# Patient Record
Sex: Female | Born: 1950 | Race: White | Hispanic: No | Marital: Single | Smoking: Current every day smoker
Health system: Southern US, Community
[De-identification: ages and names within clinical notes are randomized; demographics above are authoritative.]

## PROBLEM LIST (undated history)

## (undated) DIAGNOSIS — R531 Weakness: Secondary | ICD-10-CM

## (undated) DIAGNOSIS — J449 Chronic obstructive pulmonary disease, unspecified: Secondary | ICD-10-CM

## (undated) DIAGNOSIS — F609 Personality disorder, unspecified: Secondary | ICD-10-CM

## (undated) DIAGNOSIS — F319 Bipolar disorder, unspecified: Secondary | ICD-10-CM

## (undated) DIAGNOSIS — M8580 Other specified disorders of bone density and structure, unspecified site: Secondary | ICD-10-CM

## (undated) HISTORY — PX: OTHER SURGICAL HISTORY: SHX169

---

## 1998-03-26 ENCOUNTER — Encounter: Admission: RE | Admit: 1998-03-26 | Discharge: 1998-03-26 | Payer: Self-pay | Admitting: Family Medicine

## 1999-03-22 ENCOUNTER — Inpatient Hospital Stay (HOSPITAL_COMMUNITY): Admission: AD | Admit: 1999-03-22 | Discharge: 1999-04-01 | Payer: Self-pay | Admitting: Specialist

## 1999-08-07 ENCOUNTER — Encounter: Admission: RE | Admit: 1999-08-07 | Discharge: 1999-08-07 | Payer: Self-pay | Admitting: Family Medicine

## 1999-11-18 ENCOUNTER — Encounter: Admission: RE | Admit: 1999-11-18 | Discharge: 1999-11-18 | Payer: Self-pay | Admitting: Sports Medicine

## 1999-12-23 ENCOUNTER — Encounter: Payer: Self-pay | Admitting: Sports Medicine

## 1999-12-23 ENCOUNTER — Encounter: Admission: RE | Admit: 1999-12-23 | Discharge: 1999-12-23 | Payer: Self-pay | Admitting: *Deleted

## 2001-05-04 ENCOUNTER — Ambulatory Visit (HOSPITAL_COMMUNITY): Admission: RE | Admit: 2001-05-04 | Discharge: 2001-05-04 | Payer: Self-pay | Admitting: Family Medicine

## 2002-07-20 ENCOUNTER — Emergency Department (HOSPITAL_COMMUNITY): Admission: EM | Admit: 2002-07-20 | Discharge: 2002-07-20 | Payer: Self-pay | Admitting: Emergency Medicine

## 2003-06-18 ENCOUNTER — Encounter: Payer: Self-pay | Admitting: Internal Medicine

## 2003-06-18 ENCOUNTER — Encounter: Admission: RE | Admit: 2003-06-18 | Discharge: 2003-06-18 | Payer: Self-pay | Admitting: Internal Medicine

## 2004-07-02 ENCOUNTER — Encounter: Admission: RE | Admit: 2004-07-02 | Discharge: 2004-07-02 | Payer: Self-pay | Admitting: Internal Medicine

## 2006-01-21 ENCOUNTER — Encounter: Admission: RE | Admit: 2006-01-21 | Discharge: 2006-01-21 | Payer: Self-pay | Admitting: Internal Medicine

## 2007-01-22 ENCOUNTER — Emergency Department (HOSPITAL_COMMUNITY): Admission: EM | Admit: 2007-01-22 | Discharge: 2007-01-22 | Payer: Self-pay | Admitting: Emergency Medicine

## 2010-08-06 ENCOUNTER — Encounter: Admission: RE | Admit: 2010-08-06 | Discharge: 2010-08-06 | Payer: Self-pay | Admitting: Geriatric Medicine

## 2010-12-29 ENCOUNTER — Encounter: Payer: Self-pay | Admitting: Internal Medicine

## 2011-10-08 ENCOUNTER — Other Ambulatory Visit: Payer: Self-pay | Admitting: Geriatric Medicine

## 2011-10-08 DIAGNOSIS — Z1231 Encounter for screening mammogram for malignant neoplasm of breast: Secondary | ICD-10-CM

## 2011-11-17 ENCOUNTER — Ambulatory Visit: Payer: Self-pay

## 2012-01-11 ENCOUNTER — Emergency Department (HOSPITAL_COMMUNITY)
Admission: EM | Admit: 2012-01-11 | Discharge: 2012-01-12 | Disposition: A | Discharge status: Home / Self Care | Payer: Medicaid Other | Attending: Emergency Medicine | Admitting: Emergency Medicine

## 2012-01-11 ENCOUNTER — Encounter (HOSPITAL_COMMUNITY): Payer: Self-pay | Admitting: *Deleted

## 2012-01-11 DIAGNOSIS — M25539 Pain in unspecified wrist: Secondary | ICD-10-CM | POA: Insufficient documentation

## 2012-01-11 DIAGNOSIS — W1809XA Striking against other object with subsequent fall, initial encounter: Secondary | ICD-10-CM | POA: Insufficient documentation

## 2012-01-11 DIAGNOSIS — S52509A Unspecified fracture of the lower end of unspecified radius, initial encounter for closed fracture: Secondary | ICD-10-CM | POA: Insufficient documentation

## 2012-01-11 DIAGNOSIS — M533 Sacrococcygeal disorders, not elsewhere classified: Secondary | ICD-10-CM | POA: Insufficient documentation

## 2012-01-11 DIAGNOSIS — F17200 Nicotine dependence, unspecified, uncomplicated: Secondary | ICD-10-CM | POA: Insufficient documentation

## 2012-01-11 DIAGNOSIS — Z8659 Personal history of other mental and behavioral disorders: Secondary | ICD-10-CM | POA: Insufficient documentation

## 2012-01-11 DIAGNOSIS — S52209A Unspecified fracture of shaft of unspecified ulna, initial encounter for closed fracture: Secondary | ICD-10-CM

## 2012-01-11 NOTE — ED Notes (Signed)
Bed:WA05<BR> Expected date:<BR> Expected time:<BR> Means of arrival:<BR> Comments:<BR> EMS

## 2012-01-11 NOTE — ED Notes (Signed)
Pt. Brought in by EMS from [facility], presents with right hand pain after patient tripped and fell approx. 3 hours ago. Patient reports hitting right hand, coccyx, and back of head. Small hematoma noted to right posterior hand/wrist. Patient also reports "hearing voices" lately and would like to be seen for that as well.

## 2012-01-12 ENCOUNTER — Emergency Department (HOSPITAL_COMMUNITY): Payer: Medicaid Other

## 2012-01-12 ENCOUNTER — Encounter (HOSPITAL_COMMUNITY): Payer: Self-pay | Admitting: Emergency Medicine

## 2012-01-12 MED ORDER — OXYCODONE-ACETAMINOPHEN 5-325 MG PO TABS
1.0000 | ORAL_TABLET | Freq: Once | ORAL | Status: AC
  Administered 2012-01-12: 1 via ORAL
  Filled 2012-01-12: qty 1

## 2012-01-12 MED ORDER — IBUPROFEN 800 MG PO TABS: 800.0000 mg | ORAL_TABLET | Freq: Three times a day (TID) | ORAL | Status: AC

## 2012-01-12 MED ORDER — IBUPROFEN 800 MG PO TABS
800.0000 mg | ORAL_TABLET | Freq: Once | ORAL | Status: AC
  Administered 2012-01-12: 800 mg via ORAL
  Filled 2012-01-12: qty 1

## 2012-01-12 MED ORDER — OXYCODONE-ACETAMINOPHEN 5-325 MG PO TABS: 1.0000 | ORAL_TABLET | Freq: Four times a day (QID) | ORAL | Status: AC | PRN

## 2012-01-12 NOTE — ED Provider Notes (Signed)
History     CSN: [id number]  Arrival date & time 01/11/12  2205   First MD Initiated Contact with Patient 01/12/12 0010      Chief Complaint  Patient presents with  . Hand Injury    RIGHT  . Fall    (Consider location/radiation/quality/duration/timing/severity/associated sxs/prior treatment) HPI Patient is a left-hand dominant 61 yo F who presents after mechanical fall with right wrist pain that is a 10/10.  Patient is neurovascularly intact with pain over the dorsum of the right wrist with slight deformity and hematoma.  She also endorses striking her head and landing on her tailbone tough she denies loss of consciousness or neck or back pain.  Patient has history of mental health issues but is organized and appropriate here with no suicidal or homicidal ideation.  She does not feel she needs mental health evaluation this evening.  Patient has no other injuries and her injuries are worse with movement and palpation though she can ambulate easily.  There are no other associated or modifying factors.  Past Medical History  Diagnosis Date  . Schizoaffective disorder     History reviewed. No pertinent past surgical history.  History reviewed. No pertinent family history.  History  Substance Use Topics  . Smoking status: Current Everyday Smoker -- 2.5 packs/day    Types: Cigarettes  . Smokeless tobacco: Never Used  . Alcohol Use: Yes     once a month    OB History    Grav Para Term Preterm Abortions TAB SAB Ect Mult Living                  Review of Systems  Constitutional: Negative.   HENT: Negative for neck pain.        Pain on the back of her head where she fell  Eyes: Negative.   Respiratory: Negative.   Cardiovascular: Negative.   Gastrointestinal: Negative.   Genitourinary: Negative.   Musculoskeletal: Negative for back pain.       Right wrist and sacral pain  Skin: Negative.   Neurological: Negative.   Hematological: Negative.   Psychiatric/Behavioral:  Negative.   All other systems reviewed and are negative.    Allergies  Review of patient's allergies indicates no known allergies.  Home Medications  No current outpatient prescriptions on file.  BP 132/73  Pulse 78  Temp(Src) 97.9 F (36.6 C) (Oral)  Resp 17  Ht 5\' 2"  (1.575 m)  Wt 111 lb (50.349 kg)  BMI 20.30 kg/m2  SpO2 100%  Physical Exam  Nursing note and vitals reviewed. GEN: Well-developed, female in no distress who is thin and appears older than stated age HEENT: Normocephalic. Oropharynx clear without erythema, small hematoma over the occiput EYES: PERRLA BL, no scleral icterus. NECK: Trachea midline, no meningismus CV: regular rate and rhythm. No murmurs, rubs, or gallops PULM: No respiratory distress.  No crackles, wheezes, or rales. GI: soft, non-tender. No guarding, rebound, or tenderness. + bowel sounds  Neuro: cranial nerves 2-12 intact, no abnormalities of strength or sensation, A and O x 3 MSK:c/t/l spine with no TTP or step-off, slight sacral TTP but patient ambulates easily, right wrist with deformity and slight bruising noted dorsally, NVI Psych: abnormal affect but organized and oriented with no active psychosis, delusions, SI, or HI  ED Course  Procedures (including critical care time)  Labs Reviewed - No data to display Dg Pelvis 1-2 Views  01/12/2012  *RADIOLOGY REPORT*  Clinical Data: Pain post fall  PELVIS -  1-2 VIEW  Comparison: None.  Findings: Negative for fracture, dislocation, or other acute abnormality.  Normal alignment and mineralization. No significant degenerative change.  Regional soft tissues unremarkable.  IMPRESSION:  Negative  Original Report Authenticated By: Osa Craver, M.D.   Dg Wrist Complete Right  01/12/2012  *RADIOLOGY REPORT*  Clinical Data: Pain post fall.  RIGHT WRIST - COMPLETE 3+ VIEW  Comparison: None.  Findings: There is a minimally distracted fracture of the ulnar styloid.  There is a transverse fracture of  the distal radial metaphysis, dorsally impacted .  There is neutral angulation of the distal radial articular surface.  No  definite intra-articular extension of the fracture is evident.  Carpal rows intact.  Normal mineralization and alignment.  IMPRESSION:  1.  Transverse distal radial fracture with neutral angulation. 2.  Minimally distracted ulnar styloid fracture.  Original Report Authenticated By: Osa Craver, M.D.   Ct Head Wo Contrast  01/12/2012  *RADIOLOGY REPORT*  Clinical Data: Schizoaffective disorder  CT HEAD WITHOUT CONTRAST  Technique:  Contiguous axial images were obtained from the base of the skull through the vertex without contrast.  Comparison: None.  Findings: Periventricular and subcortical white matter hypodensities are nonspecific however most often seen with chronic microangiopathic change. There is no evidence for acute hemorrhage, hydrocephalus, mass lesion, or abnormal extra-axial fluid collection.  No definite CT evidence for acute infarction.  The visualized paranasal sinuses and mastoid air cells are predominately clear.  IMPRESSION: Mild white matter hypodensities.  No definite acute intracranial abnormality.  Original Report Authenticated By: Waneta Martins, M.D.     1. Fracture of radius and ulna, closed       MDM  Patient with right radius and ulna fracture without tenting or open defect.  Given other symptoms CT head and plain film of sacrum were performed and unremarkable.  She had percocet and ibuprofen for pain.  Ice and sugartong splint were applied.  Patient was reassesed following splinting and was NVI.  Patient was referred to Dr. Mina Marble who was on for hand.  She was discharged with prescriptions for percocet and ibuprofen in good condition and stated understanding of discharge instructions.        Cyndra Numbers, MD 01/12/12 [phone]

## 2012-01-12 NOTE — ED Notes (Signed)
Patient picked up by Zenaida Niece and transported to [facility] with staff from same. No complaints of pain or discomfort. No questions about discharge. VSS.

## 2012-01-12 NOTE — ED Notes (Signed)
Patient transported to CT 

## 2012-01-27 ENCOUNTER — Ambulatory Visit: Payer: Self-pay

## 2012-02-04 ENCOUNTER — Ambulatory Visit: Payer: Self-pay

## 2012-02-05 DEATH — deceased

## 2012-02-11 ENCOUNTER — Ambulatory Visit: Payer: Medicaid Other | Admitting: Occupational Therapy

## 2012-02-16 ENCOUNTER — Ambulatory Visit: Payer: Medicaid Other | Attending: Orthopedic Surgery | Admitting: Occupational Therapy

## 2012-02-16 DIAGNOSIS — R609 Edema, unspecified: Secondary | ICD-10-CM | POA: Insufficient documentation

## 2012-02-16 DIAGNOSIS — M25539 Pain in unspecified wrist: Secondary | ICD-10-CM | POA: Insufficient documentation

## 2012-02-16 DIAGNOSIS — IMO0001 Reserved for inherently not codable concepts without codable children: Secondary | ICD-10-CM | POA: Insufficient documentation

## 2012-02-24 ENCOUNTER — Encounter: Payer: Medicaid Other | Admitting: Occupational Therapy

## 2012-07-01 ENCOUNTER — Encounter (HOSPITAL_COMMUNITY): Payer: Self-pay | Admitting: *Deleted

## 2012-07-01 DIAGNOSIS — G510 Bell's palsy: Secondary | ICD-10-CM | POA: Insufficient documentation

## 2012-07-01 DIAGNOSIS — F259 Schizoaffective disorder, unspecified: Secondary | ICD-10-CM | POA: Insufficient documentation

## 2012-07-01 DIAGNOSIS — M949 Disorder of cartilage, unspecified: Secondary | ICD-10-CM | POA: Insufficient documentation

## 2012-07-01 DIAGNOSIS — F17200 Nicotine dependence, unspecified, uncomplicated: Secondary | ICD-10-CM | POA: Insufficient documentation

## 2012-07-01 DIAGNOSIS — F319 Bipolar disorder, unspecified: Secondary | ICD-10-CM | POA: Insufficient documentation

## 2012-07-01 DIAGNOSIS — M899 Disorder of bone, unspecified: Secondary | ICD-10-CM | POA: Insufficient documentation

## 2012-07-01 NOTE — ED Notes (Addendum)
Per EMS: pt started having right facial "droop" and and her right eye would not close. Pt states that her right side of face is more sensitive than left. Pt also worried about sores on chin. Pt from [facility]. Pt ambulatory and no neuro deficits.

## 2012-07-02 ENCOUNTER — Emergency Department (HOSPITAL_COMMUNITY): Payer: Medicaid Other

## 2012-07-02 ENCOUNTER — Emergency Department (HOSPITAL_COMMUNITY)
Admission: EM | Admit: 2012-07-02 | Discharge: 2012-07-02 | Disposition: A | Discharge status: Home / Self Care | Payer: Medicaid Other | Attending: Emergency Medicine | Admitting: Emergency Medicine

## 2012-07-02 DIAGNOSIS — G510 Bell's palsy: Secondary | ICD-10-CM

## 2012-07-02 HISTORY — DX: Weakness: R53.1

## 2012-07-02 HISTORY — DX: Personality disorder, unspecified: F60.9

## 2012-07-02 HISTORY — DX: Bipolar disorder, unspecified: F31.9

## 2012-07-02 HISTORY — DX: Other specified disorders of bone density and structure, unspecified site: M85.80

## 2012-07-02 LAB — URINALYSIS, ROUTINE W REFLEX MICROSCOPIC
Nitrite: NEGATIVE
Specific Gravity, Urine: 1.005 (ref 1.005–1.030)
Urobilinogen, UA: 0.2 mg/dL (ref 0.0–1.0)

## 2012-07-02 LAB — URINE MICROSCOPIC-ADD ON

## 2012-07-02 MED ORDER — POLYVINYL ALCOHOL 1.4 % OP SOLN
2.0000 [drp] | OPHTHALMIC | Status: DC | PRN
  Administered 2012-07-02: 2 [drp] via OPHTHALMIC
  Filled 2012-07-02: qty 15

## 2012-07-02 MED ORDER — PREDNISONE 20 MG PO TABS: 60.0000 mg | ORAL_TABLET | Freq: Every day | ORAL | Status: AC

## 2012-07-02 MED ORDER — VALACYCLOVIR HCL 1 G PO TABS: 1000.0000 mg | ORAL_TABLET | Freq: Three times a day (TID) | ORAL | Status: AC

## 2012-07-02 NOTE — ED Provider Notes (Signed)
History     CSN: [id number]  Arrival date & time 07/01/12  2320   First MD Initiated Contact with Patient 07/02/12 0242     2:23 AM HPI This reports the last 3 days has had increasing left-sided facial droop and difficulty closing her eye. Reports difficulty speaking because of mouth drooping. Denies difficulty ambulating. Denies numbness tingling of extremities. Patient reports initially small lesions on her chin prior to symptoms. No improvement with rest.Denies headache The history is provided by the patient.    Past Medical History  Diagnosis Date  . Schizoaffective disorder   . Personality disorder   . Osteopenia   . Bipolar disorder   . Weakness     History reviewed. No pertinent past surgical history.  History reviewed. No pertinent family history.  History  Substance Use Topics  . Smoking status: Current Everyday Smoker -- 2.5 packs/day    Types: Cigarettes  . Smokeless tobacco: Never Used  . Alcohol Use: Yes     once a month    OB History    Grav Para Term Preterm Abortions TAB SAB Ect Mult Living                  Review of Systems  Eyes: Positive for redness.  Neurological: Positive for facial asymmetry and speech difficulty. Negative for dizziness, syncope, weakness, light-headedness, numbness and headaches.    Allergies  Review of patient's allergies indicates no known allergies.  Home Medications   Current Outpatient Rx  Name Route Sig Dispense Refill  . LITHIUM CARBONATE ER 300 MG PO TBCR Oral Take 300 mg by mouth 3 (three) times daily.     Marland Kitchen OLANZAPINE 10 MG PO TABS Oral Take 10 mg by mouth at bedtime.    . TOPIRAMATE 25 MG PO TABS Oral Take 25 mg by mouth 2 (two) times daily.      BP 131/88  Pulse 67  Temp 97.7 F (36.5 C) (Oral)  Resp 18  SpO2 100%  Physical Exam  Vitals reviewed. Constitutional: She is oriented to person, place, and time. Vital signs are normal. She appears well-developed and well-nourished.  HENT:  Head:  Normocephalic and atraumatic.  Right Ear: External ear normal.  Left Ear: External ear normal.  Nose: Nose normal.  Mouth/Throat: Oropharynx is clear and moist. No oropharyngeal exudate.  Eyes: EOM are normal. Pupils are equal, round, and reactive to light.       Left ptosis and conjunctival erythema and injection .   Neck: Normal range of motion. Neck supple.  Cardiovascular: Normal rate, regular rhythm and normal heart sounds.  Exam reveals no friction rub.   No murmur heard. Pulmonary/Chest: Effort normal and breath sounds normal. She has no wheezes. She has no rhonchi. She has no rales. She exhibits no tenderness.  Musculoskeletal: Normal range of motion.  Neurological: She is alert and oriented to person, place, and time. No cranial nerve deficit (tested CN III-XII). Coordination (Negative past-pointing, pronator drift) normal.       Left-sided facial droop and inability to wrinkle left for head.  Skin: Skin is warm and dry. No rash noted. No erythema. No pallor.    ED Course  Procedures   Results for orders placed during the hospital encounter of 07/02/12  URINALYSIS, ROUTINE W REFLEX MICROSCOPIC      Component Value Range   Color, Urine YELLOW  YELLOW   APPearance CLEAR  CLEAR   Specific Gravity, Urine 1.005  1.005 - 1.030   pH  7.0  5.0 - 8.0   Glucose, UA NEGATIVE  NEGATIVE mg/dL   Hgb urine dipstick NEGATIVE  NEGATIVE   Bilirubin Urine NEGATIVE  NEGATIVE   Ketones, ur NEGATIVE  NEGATIVE mg/dL   Protein, ur NEGATIVE  NEGATIVE mg/dL   Urobilinogen, UA 0.2  0.0 - 1.0 mg/dL   Nitrite NEGATIVE  NEGATIVE   Leukocytes, UA SMALL (*) NEGATIVE  URINE MICROSCOPIC-ADD ON      Component Value Range   Squamous Epithelial / LPF RARE  RARE   WBC, UA 3-6  <3 WBC/hpf   RBC / HPF 0-2  <3 RBC/hpf   Bacteria, UA RARE  RARE   Ct Head Wo Contrast  07/02/2012  *RADIOLOGY REPORT*  Clinical Data: Left facial droop.  CT HEAD WITHOUT CONTRAST  Technique:  Contiguous axial images were  obtained from the base of the skull through the vertex without contrast.  Comparison: 01/12/2012  Findings: Periventricular and subcortical white matter hypodensities are most in keeping with chronic microangiopathic change. There is no evidence for acute hemorrhage, hydrocephalus, mass lesion, or abnormal extra-axial fluid collection.  No definite CT evidence for acute infarction.  The visualized paranasal sinuses and mastoid air cells are predominately clear.  IMPRESSION: Mild white matter changes are similar to prior.  No definite CT evidence of acute intracranial abnormality. MRI recommended if clinical concern for acute ischemia persists.  Original Report Authenticated By: Waneta Martins, M.D.       MDM  Exam was negative, no focal neurological findings. CT scan was negative. Patient has obvious Bell's palsy. Treated with one week of prednisone and valacyclovir. Also given a clear eye patch and advised taking I during the evening. Refer patient to ophthalmology and neurology. Patient voices understanding and is ready for discharge       [name], Cordelia Poche 07/02/12 0502

## 2012-07-02 NOTE — ED Provider Notes (Signed)
Medical screening examination/treatment/procedure(s) were conducted as a shared visit with non-physician practitioner(s) and myself.  I personally evaluated the patient during the encounter  3 days of L facial droop.  Forehead not spared.  Flaccid L face, unable to close eye.  5/5 strength extremities. Exam consistent with Bell's palsy.  Glynn Octave, MD 07/02/12 [phone]

## 2012-12-15 ENCOUNTER — Other Ambulatory Visit: Payer: Self-pay | Admitting: Geriatric Medicine

## 2012-12-15 DIAGNOSIS — Z1231 Encounter for screening mammogram for malignant neoplasm of breast: Secondary | ICD-10-CM

## 2013-01-11 ENCOUNTER — Ambulatory Visit: Payer: Medicaid Other

## 2013-10-07 DEATH — deceased

## 2015-01-15 ENCOUNTER — Other Ambulatory Visit: Payer: Self-pay | Admitting: Orthopaedic Surgery

## 2015-01-15 DIAGNOSIS — M25572 Pain in left ankle and joints of left foot: Secondary | ICD-10-CM

## 2015-01-28 ENCOUNTER — Other Ambulatory Visit: Payer: Medicaid Other

## 2015-01-30 ENCOUNTER — Ambulatory Visit
Admission: RE | Admit: 2015-01-30 | Discharge: 2015-01-30 | Disposition: A | Discharge status: Home / Self Care | Payer: Medicaid Other | Source: Ambulatory Visit | Attending: Orthopaedic Surgery | Admitting: Orthopaedic Surgery

## 2015-01-30 DIAGNOSIS — M25572 Pain in left ankle and joints of left foot: Secondary | ICD-10-CM

## 2015-01-30 MED ORDER — GADOBENATE DIMEGLUMINE 529 MG/ML IV SOLN
9.0000 mL | Freq: Once | INTRAVENOUS | Status: AC | PRN
  Administered 2015-01-30: 9 mL via INTRAVENOUS

## 2015-07-23 ENCOUNTER — Inpatient Hospital Stay (HOSPITAL_COMMUNITY)
Admission: EM | Admit: 2015-07-23 | Discharge: 2015-07-29 | DRG: 194 | Disposition: A | Discharge status: Home / Self Care | Payer: Medicaid Other | Attending: Internal Medicine | Admitting: Internal Medicine

## 2015-07-23 ENCOUNTER — Encounter (HOSPITAL_COMMUNITY): Payer: Self-pay | Admitting: Emergency Medicine

## 2015-07-23 ENCOUNTER — Emergency Department (HOSPITAL_COMMUNITY): Payer: Medicaid Other

## 2015-07-23 DIAGNOSIS — A419 Sepsis, unspecified organism: Secondary | ICD-10-CM

## 2015-07-23 DIAGNOSIS — J440 Chronic obstructive pulmonary disease with acute lower respiratory infection: Secondary | ICD-10-CM | POA: Diagnosis not present

## 2015-07-23 DIAGNOSIS — J449 Chronic obstructive pulmonary disease, unspecified: Secondary | ICD-10-CM | POA: Diagnosis present

## 2015-07-23 DIAGNOSIS — T43595A Adverse effect of other antipsychotics and neuroleptics, initial encounter: Secondary | ICD-10-CM | POA: Diagnosis present

## 2015-07-23 DIAGNOSIS — Y95 Nosocomial condition: Secondary | ICD-10-CM | POA: Diagnosis present

## 2015-07-23 DIAGNOSIS — J189 Pneumonia, unspecified organism: Principal | ICD-10-CM

## 2015-07-23 DIAGNOSIS — N179 Acute kidney failure, unspecified: Secondary | ICD-10-CM | POA: Diagnosis present

## 2015-07-23 DIAGNOSIS — F319 Bipolar disorder, unspecified: Secondary | ICD-10-CM | POA: Diagnosis present

## 2015-07-23 DIAGNOSIS — F17210 Nicotine dependence, cigarettes, uncomplicated: Secondary | ICD-10-CM | POA: Diagnosis present

## 2015-07-23 DIAGNOSIS — Z79899 Other long term (current) drug therapy: Secondary | ICD-10-CM | POA: Diagnosis not present

## 2015-07-23 DIAGNOSIS — N251 Nephrogenic diabetes insipidus: Secondary | ICD-10-CM | POA: Diagnosis present

## 2015-07-23 DIAGNOSIS — Z23 Encounter for immunization: Secondary | ICD-10-CM

## 2015-07-23 DIAGNOSIS — Z825 Family history of asthma and other chronic lower respiratory diseases: Secondary | ICD-10-CM

## 2015-07-23 DIAGNOSIS — F609 Personality disorder, unspecified: Secondary | ICD-10-CM | POA: Diagnosis present

## 2015-07-23 DIAGNOSIS — F259 Schizoaffective disorder, unspecified: Secondary | ICD-10-CM | POA: Diagnosis present

## 2015-07-23 HISTORY — DX: Chronic obstructive pulmonary disease, unspecified: J44.9

## 2015-07-23 LAB — CBC WITH DIFFERENTIAL/PLATELET
BASOS PCT: 0 % (ref 0–1)
Basophils Absolute: 0 10*3/uL (ref 0.0–0.1)
EOS ABS: 0 10*3/uL (ref 0.0–0.7)
EOS PCT: 0 % (ref 0–5)
HCT: 42.3 % (ref 36.0–46.0)
Hemoglobin: 13.7 g/dL (ref 12.0–15.0)
LYMPHS ABS: 1.1 10*3/uL (ref 0.7–4.0)
Lymphocytes Relative: 5 % — ABNORMAL LOW (ref 12–46)
MCH: 33.7 pg (ref 26.0–34.0)
MCHC: 32.4 g/dL (ref 30.0–36.0)
MCV: 104.2 fL — AB (ref 78.0–100.0)
MONO ABS: 2.5 10*3/uL — AB (ref 0.1–1.0)
Monocytes Relative: 11 % (ref 3–12)
Neutro Abs: 18.9 10*3/uL — ABNORMAL HIGH (ref 1.7–7.7)
Neutrophils Relative %: 84 % — ABNORMAL HIGH (ref 43–77)
PLATELETS: 232 10*3/uL (ref 150–400)
RBC: 4.06 MIL/uL (ref 3.87–5.11)
RDW: 13.5 % (ref 11.5–15.5)
WBC: 22.5 10*3/uL — AB (ref 4.0–10.5)

## 2015-07-23 LAB — CREATININE, SERUM
Creatinine, Ser: 1.04 mg/dL — ABNORMAL HIGH (ref 0.44–1.00)
GFR, EST NON AFRICAN AMERICAN: 56 mL/min — AB (ref >60)

## 2015-07-23 LAB — COMPREHENSIVE METABOLIC PANEL
ALK PHOS: 79 U/L (ref 38–126)
ALT: 13 U/L — AB (ref 14–54)
AST: 25 U/L (ref 15–41)
Albumin: 3.6 g/dL (ref 3.5–5.0)
Anion gap: 12 (ref 5–15)
BUN: 9 mg/dL (ref 6–20)
CALCIUM: 9.3 mg/dL (ref 8.9–10.3)
CHLORIDE: 106 mmol/L (ref 101–111)
CO2: 17 mmol/L — ABNORMAL LOW (ref 22–32)
CREATININE: 1.11 mg/dL — AB (ref 0.44–1.00)
GFR, EST AFRICAN AMERICAN: 60 mL/min — AB (ref >60)
GFR, EST NON AFRICAN AMERICAN: 52 mL/min — AB (ref >60)
Glucose, Bld: 202 mg/dL — ABNORMAL HIGH (ref 65–99)
Potassium: 4.2 mmol/L (ref 3.5–5.1)
Sodium: 135 mmol/L (ref 135–145)
Total Bilirubin: 0.5 mg/dL (ref 0.3–1.2)
Total Protein: 6.6 g/dL (ref 6.5–8.1)

## 2015-07-23 LAB — URINALYSIS, ROUTINE W REFLEX MICROSCOPIC
BILIRUBIN URINE: NEGATIVE
Glucose, UA: NEGATIVE mg/dL
Hgb urine dipstick: NEGATIVE
KETONES UR: NEGATIVE mg/dL
Leukocytes, UA: NEGATIVE
NITRITE: NEGATIVE
PROTEIN: NEGATIVE mg/dL
Specific Gravity, Urine: 1.006 (ref 1.005–1.030)
UROBILINOGEN UA: 0.2 mg/dL (ref 0.0–1.0)
pH: 6 (ref 5.0–8.0)

## 2015-07-23 LAB — LITHIUM LEVEL: Lithium Lvl: 0.5 mmol/L — ABNORMAL LOW (ref 0.60–1.20)

## 2015-07-23 LAB — PROCALCITONIN: PROCALCITONIN: 0.81 ng/mL

## 2015-07-23 LAB — LACTIC ACID, PLASMA: Lactic Acid, Venous: 1.1 mmol/L (ref 0.5–2.0)

## 2015-07-23 LAB — CBC
HEMATOCRIT: 39.8 % (ref 36.0–46.0)
HEMOGLOBIN: 12.7 g/dL (ref 12.0–15.0)
MCH: 33.1 pg (ref 26.0–34.0)
MCHC: 31.9 g/dL (ref 30.0–36.0)
MCV: 103.6 fL — ABNORMAL HIGH (ref 78.0–100.0)
Platelets: 221 10*3/uL (ref 150–400)
RBC: 3.84 MIL/uL — ABNORMAL LOW (ref 3.87–5.11)
RDW: 13.6 % (ref 11.5–15.5)
WBC: 14.3 10*3/uL — AB (ref 4.0–10.5)

## 2015-07-23 LAB — I-STAT CG4 LACTIC ACID, ED
LACTIC ACID, VENOUS: 0.89 mmol/L (ref 0.5–2.0)
Lactic Acid, Venous: 3.69 mmol/L (ref 0.5–2.0)

## 2015-07-23 LAB — TROPONIN I

## 2015-07-23 MED ORDER — PIPERACILLIN-TAZOBACTAM 3.375 G IVPB 30 MIN: 3.3750 g | Freq: Three times a day (TID) | INTRAVENOUS | Status: DC

## 2015-07-23 MED ORDER — SODIUM CHLORIDE 0.9 % IV BOLUS (SEPSIS)
500.0000 mL | INTRAVENOUS | Status: AC
  Administered 2015-07-23: 500 mL via INTRAVENOUS

## 2015-07-23 MED ORDER — ONDANSETRON HCL 4 MG PO TABS: 4.0000 mg | ORAL_TABLET | Freq: Four times a day (QID) | ORAL | Status: DC | PRN

## 2015-07-23 MED ORDER — VANCOMYCIN HCL IN DEXTROSE 1-5 GM/200ML-% IV SOLN
1000.0000 mg | Freq: Once | INTRAVENOUS | Status: AC
  Administered 2015-07-23: 1000 mg via INTRAVENOUS
  Filled 2015-07-23: qty 200

## 2015-07-23 MED ORDER — OLANZAPINE 7.5 MG PO TABS
15.0000 mg | ORAL_TABLET | Freq: Every day | ORAL | Status: DC
  Administered 2015-07-23 – 2015-07-28 (×6): 15 mg via ORAL
  Filled 2015-07-23 (×7): qty 2

## 2015-07-23 MED ORDER — OLANZAPINE 5 MG PO TABS
5.0000 mg | ORAL_TABLET | Freq: Every day | ORAL | Status: DC
  Administered 2015-07-24 – 2015-07-29 (×6): 5 mg via ORAL
  Filled 2015-07-23 (×7): qty 1

## 2015-07-23 MED ORDER — PIPERACILLIN-TAZOBACTAM 3.375 G IVPB 30 MIN
3.3750 g | Freq: Once | INTRAVENOUS | Status: AC
  Administered 2015-07-23: 3.375 g via INTRAVENOUS
  Filled 2015-07-23: qty 50

## 2015-07-23 MED ORDER — TOPIRAMATE 25 MG PO TABS
25.0000 mg | ORAL_TABLET | Freq: Every day | ORAL | Status: DC
  Administered 2015-07-24 – 2015-07-29 (×6): 25 mg via ORAL
  Filled 2015-07-23 (×7): qty 1

## 2015-07-23 MED ORDER — SODIUM CHLORIDE 0.9 % IV BOLUS (SEPSIS)
1000.0000 mL | Freq: Once | INTRAVENOUS | Status: AC
  Administered 2015-07-23: 1000 mL via INTRAVENOUS

## 2015-07-23 MED ORDER — LITHIUM CARBONATE ER 300 MG PO TBCR
300.0000 mg | EXTENDED_RELEASE_TABLET | Freq: Two times a day (BID) | ORAL | Status: DC
  Filled 2015-07-23: qty 2

## 2015-07-23 MED ORDER — VANCOMYCIN HCL IN DEXTROSE 1-5 GM/200ML-% IV SOLN: 1000.0000 mg | Freq: Once | INTRAVENOUS | Status: DC

## 2015-07-23 MED ORDER — LITHIUM CARBONATE ER 300 MG PO TBCR
600.0000 mg | EXTENDED_RELEASE_TABLET | Freq: Every day | ORAL | Status: DC
  Administered 2015-07-24 – 2015-07-25 (×3): 600 mg via ORAL
  Filled 2015-07-23 (×5): qty 2

## 2015-07-23 MED ORDER — ACETAMINOPHEN 650 MG RE SUPP: 650.0000 mg | Freq: Four times a day (QID) | RECTAL | Status: DC | PRN

## 2015-07-23 MED ORDER — ENOXAPARIN SODIUM 40 MG/0.4ML ~~LOC~~ SOLN
40.0000 mg | SUBCUTANEOUS | Status: DC
  Administered 2015-07-23 – 2015-07-28 (×6): 40 mg via SUBCUTANEOUS
  Filled 2015-07-23 (×6): qty 0.4

## 2015-07-23 MED ORDER — SODIUM CHLORIDE 0.9 % IV SOLN
INTRAVENOUS | Status: AC
  Administered 2015-07-23 – 2015-07-24 (×2): via INTRAVENOUS

## 2015-07-23 MED ORDER — ACETAMINOPHEN 325 MG PO TABS
650.0000 mg | ORAL_TABLET | Freq: Four times a day (QID) | ORAL | Status: DC | PRN
  Administered 2015-07-24 – 2015-07-28 (×5): 650 mg via ORAL
  Filled 2015-07-23 (×7): qty 2

## 2015-07-23 MED ORDER — IPRATROPIUM BROMIDE 0.02 % IN SOLN
0.5000 mg | RESPIRATORY_TRACT | Status: DC
  Administered 2015-07-24 – 2015-07-25 (×11): 0.5 mg via RESPIRATORY_TRACT
  Filled 2015-07-23 (×11): qty 2.5

## 2015-07-23 MED ORDER — MOMETASONE FURO-FORMOTEROL FUM 100-5 MCG/ACT IN AERO
2.0000 | INHALATION_SPRAY | Freq: Two times a day (BID) | RESPIRATORY_TRACT | Status: DC
  Administered 2015-07-24 – 2015-07-29 (×9): 2 via RESPIRATORY_TRACT
  Filled 2015-07-23 (×3): qty 8.8

## 2015-07-23 MED ORDER — ALBUTEROL SULFATE (2.5 MG/3ML) 0.083% IN NEBU: 2.5000 mg | INHALATION_SOLUTION | RESPIRATORY_TRACT | Status: DC | PRN

## 2015-07-23 MED ORDER — PIPERACILLIN-TAZOBACTAM 3.375 G IVPB
3.3750 g | Freq: Three times a day (TID) | INTRAVENOUS | Status: DC
  Administered 2015-07-24 – 2015-07-28 (×14): 3.375 g via INTRAVENOUS
  Filled 2015-07-23 (×16): qty 50

## 2015-07-23 MED ORDER — LITHIUM CARBONATE ER 300 MG PO TBCR
300.0000 mg | EXTENDED_RELEASE_TABLET | Freq: Every day | ORAL | Status: DC
  Administered 2015-07-24 – 2015-07-26 (×3): 300 mg via ORAL
  Filled 2015-07-23 (×3): qty 1

## 2015-07-23 MED ORDER — GUAIFENESIN ER 600 MG PO TB12
600.0000 mg | ORAL_TABLET | Freq: Two times a day (BID) | ORAL | Status: DC
  Administered 2015-07-23 – 2015-07-29 (×12): 600 mg via ORAL
  Filled 2015-07-23 (×12): qty 1

## 2015-07-23 MED ORDER — ALBUTEROL SULFATE (2.5 MG/3ML) 0.083% IN NEBU
2.5000 mg | INHALATION_SOLUTION | RESPIRATORY_TRACT | Status: DC
  Administered 2015-07-24 – 2015-07-25 (×11): 2.5 mg via RESPIRATORY_TRACT
  Filled 2015-07-23 (×11): qty 3

## 2015-07-23 MED ORDER — TOPIRAMATE 25 MG PO TABS
50.0000 mg | ORAL_TABLET | Freq: Every day | ORAL | Status: DC
  Administered 2015-07-23 – 2015-07-28 (×6): 50 mg via ORAL
  Filled 2015-07-23 (×6): qty 2

## 2015-07-23 MED ORDER — OLANZAPINE 5 MG PO TABS: 5.0000 mg | ORAL_TABLET | Freq: Every day | ORAL | Status: DC

## 2015-07-23 MED ORDER — TOPIRAMATE 25 MG PO TABS: 25.0000 mg | ORAL_TABLET | Freq: Two times a day (BID) | ORAL | Status: DC

## 2015-07-23 MED ORDER — VANCOMYCIN HCL IN DEXTROSE 750-5 MG/150ML-% IV SOLN
750.0000 mg | INTRAVENOUS | Status: DC
  Administered 2015-07-24 – 2015-07-25 (×2): 750 mg via INTRAVENOUS
  Filled 2015-07-23 (×2): qty 150

## 2015-07-23 MED ORDER — ENSURE ENLIVE PO LIQD
237.0000 mL | Freq: Two times a day (BID) | ORAL | Status: DC
  Administered 2015-07-24 – 2015-07-29 (×10): 237 mL via ORAL

## 2015-07-23 MED ORDER — ONDANSETRON HCL 4 MG/2ML IJ SOLN: 4.0000 mg | Freq: Four times a day (QID) | INTRAMUSCULAR | Status: DC | PRN

## 2015-07-23 NOTE — ED Provider Notes (Signed)
CSN: [id number]     Arrival date & time 07/23/15  1700 History   First MD Initiated Contact with Patient 07/23/15 1703     Chief Complaint  Patient presents with  . Fever     (Consider location/radiation/quality/duration/timing/severity/associated sxs/prior Treatment) Patient is a 64 y.o. female presenting with cough. The history is provided by the patient.  Cough Cough characteristics:  Non-productive Severity:  Moderate Onset quality:  Gradual Timing:  Constant Progression:  Worsening Chronicity:  New Smoker: no   Context: not upper respiratory infection and not weather changes   Relieved by:  Nothing Worsened by:  Nothing tried Associated symptoms: no fever and no shortness of breath     Past Medical History  Diagnosis Date  . Schizoaffective disorder   . Personality disorder   . Osteopenia   . Bipolar disorder   . Weakness    History reviewed. No pertinent past surgical history. History reviewed. No pertinent family history. Social History  Substance Use Topics  . Smoking status: Current Every Day Smoker -- 2.50 packs/day    Types: Cigarettes  . Smokeless tobacco: Never Used  . Alcohol Use: Yes     Comment: once a month   OB History    No data available     Review of Systems  Constitutional: Negative for fever.  Respiratory: Negative for cough and shortness of breath.   Gastrointestinal: Negative for vomiting and abdominal pain.  All other systems reviewed and are negative.     Allergies  Review of patient's allergies indicates no known allergies.  Home Medications   Prior to Admission medications   Medication Sig Start Date End Date Taking? Authorizing Provider  lithium carbonate (LITHOBID) 300 MG CR tablet Take 300 mg by mouth 3 (three) times daily.     Historical Provider, MD  OLANZapine (ZYPREXA) 10 MG tablet Take 10 mg by mouth at bedtime.    Historical Provider, MD  topiramate (TOPAMAX) 25 MG tablet Take 25 mg by mouth 2 (two) times daily.     Historical Provider, MD   BP 147/81 mmHg  Pulse 107  Temp(Src) 103.2 F (39.6 C) (Rectal)  Resp 31  Ht 5' 2.75" (1.594 m)  Wt 104 lb (47.174 kg)  BMI 18.57 kg/m2  SpO2 95% Physical Exam  Constitutional: She is oriented to person, place, and time. She appears well-developed and well-nourished. No distress.  HENT:  Head: Normocephalic and atraumatic.  Mouth/Throat: Oropharynx is clear and moist.  Eyes: EOM are normal. Pupils are equal, round, and reactive to light.  Neck: Normal range of motion. Neck supple.  Cardiovascular: Normal rate and regular rhythm.  Exam reveals no friction rub.   No murmur heard. Pulmonary/Chest: Effort normal. No respiratory distress. She has no wheezes. She has rhonchi in the left lower field. She has no rales.  Abdominal: Soft. She exhibits no distension. There is no tenderness. There is no rebound.  Musculoskeletal: Normal range of motion. She exhibits no edema.  Neurological: She is alert and oriented to person, place, and time.  Skin: She is not diaphoretic.  Nursing note and vitals reviewed.   ED Course  Procedures (including critical care time) Labs Review Labs Reviewed  CULTURE, BLOOD (ROUTINE X 2)  CULTURE, BLOOD (ROUTINE X 2)  URINE CULTURE  COMPREHENSIVE METABOLIC PANEL  CBC WITH DIFFERENTIAL/PLATELET  URINALYSIS, ROUTINE W REFLEX MICROSCOPIC (NOT AT [facility])  I-STAT CG4 LACTIC ACID, ED    Imaging Review Dg Chest Port 1 View  07/23/2015   CLINICAL DATA:  Cough.  Shortness of breath.  Fever.  EXAM: PORTABLE CHEST - 1 VIEW  COMPARISON:  01/22/2007  FINDINGS: Tapering of the peripheral pulmonary vasculature favors emphysema. Atherosclerotic calcification of the aortic arch is present.  Heart size within normal limits. Mild symmetric interstitial accentuation of both lung bases.  IMPRESSION: 1. Emphysema. 2. Faint interstitial accentuation of both lung bases is technically nonspecific but could be a manifestation of drug reaction, subtle edema,  or atypical infectious process.   Electronically Signed   By: Gaylyn Rong M.D.   On: 07/23/2015 17:57   I have personally reviewed and evaluated these images and lab results as part of my medical decision-making.   EKG Interpretation   Date/Time:  Tuesday July 23 2015 17:48:52 EDT Ventricular Rate:  95 PR Interval:  127 QRS Duration: 93 QT Interval:  368 QTC Calculation: 463 R Axis:   34 Text Interpretation:  Sinus rhythm Atrial premature complex Probable  anteroseptal infarct, old No prior for comparison Confirmed by Gwendolyn Grant  MD,  Loel Betancur ([id number]) on 07/23/2015 5:51:07 PM      MDM   Final diagnoses:  Community acquired pneumonia    50F here with fever, general malaise. Reports coughing, back pain, weakness for past few weeks, worsening over the past day.  Here febrile, tachycardic. LLL rhonchi noted. No abdominal pain. Code sepsis initiated due to tachypnea, fever, tachycardia. Will start HCAP antibiotics. Lactate 3.7. Admitted.  Elwin Mocha, MD 07/23/15 [phone]

## 2015-07-23 NOTE — Progress Notes (Signed)
ANTIBIOTIC CONSULT NOTE - INITIAL  Pharmacy Consult for vancomycin and zosyn Indication: rule out sepsis  No Known Allergies  Patient Measurements: Height: 5' 2.75" (159.4 cm) Weight: 104 lb (47.174 kg) IBW/kg (Calculated) : 51.83 Adjusted Body Weight:   Vital Signs: Temp: 103.2 F (39.6 C) (08/16 1709) Temp Source: Rectal (08/16 1709) BP: 119/80 mmHg (08/16 1745) Pulse Rate: 88 (08/16 1745) Intake/Output from previous day:   Intake/Output from this shift:    Labs:  Recent Labs  07/23/15 1710  WBC 22.5*  HGB 13.7  PLT 232  CREATININE 1.11*   Estimated Creatinine Clearance: 38.7 mL/min (by C-G formula based on Cr of 1.11). No results for input(s): VANCOTROUGH, VANCOPEAK, VANCORANDOM, GENTTROUGH, GENTPEAK, GENTRANDOM, TOBRATROUGH, TOBRAPEAK, TOBRARND, AMIKACINPEAK, AMIKACINTROU, AMIKACIN in the last 72 hours.   Microbiology: No results found for this or any previous visit (from the past 720 hour(s)).  Medical History: Past Medical History  Diagnosis Date  . Schizoaffective disorder   . Personality disorder   . Osteopenia   . Bipolar disorder   . Weakness    Assessment: 64 yo female with a Tmax 103.2 and WBC 22.5. Lactic acid 3.69. HR 107, RR 31, BP 147/81 - rule out sepsis   Goal of Therapy:  Vancomycin trough level 15-20 mcg/ml  Plan:  Zosyn 3.375 g every 8 hours  Vancomycin 750 mg every 24 hours  Follow up cultures, s/sx infection, renal function   Sherron Monday, PharmD Clinical Pharmacy Resident Pager: [phone] 07/23/2015 6:15 PM

## 2015-07-23 NOTE — H&P (Signed)
[facility] History and Physical  Mary Avila:[id number] DOB: [birth year] DOA: 07/23/2015  Referring physician: Dr.Walden. PCP: No primary care provider on file. Dr.Tripp. Specialists: None.  Chief Complaint: Shortness of breath and fever.  HPI: Mary Avila is a 64 y.o. female with history of COPD, bipolar disorder and ongoing tobacco abuse was brought from the assisted living facility after patient was found to have increasing chest congestion shortness of breath and productive cough with fever and chills. Patient has been having increasing throat congestion and above-mentioned symptoms for last few days. In the ER patient was found to be tachycardic mildly hypotensive and lactic acid was found to be elevated and patient was febrile. Chest x-ray shows possible infiltrates. On exam patient is wheezing bilaterally. Patient was given fluid bolus for sepsis and antibiotics were started for healthcare associated pneumonia. On exam patient states he feels much better than when she came. Has mild nausea denies any abdominal pain diarrhea.   Review of Systems: As presented in the history of presenting illness, rest negative.  Past Medical History  Diagnosis Date  . Schizoaffective disorder   . Personality disorder   . Osteopenia   . Bipolar disorder   . Weakness    Past Surgical History  Procedure Laterality Date  . Right wrist surgery     Social History:  reports that she has been smoking Cigarettes.  She has been smoking about 2.50 packs per day. She has never used smokeless tobacco. She reports that she drinks alcohol. She reports that she does not use illicit drugs. Where does patient liveassisted living facility. Can patient participate in [facility].  No Known Allergies  Family History:  Family History  Problem Relation Age of Onset  . COPD Father       Prior to Admission medications   Medication Sig Start Date End Date Taking? Authorizing Provider  albuterol  (PROVENTIL HFA;VENTOLIN HFA) 108 (90 BASE) MCG/ACT inhaler Inhale 2 puffs into the lungs every 4 (four) hours as needed for wheezing or shortness of breath.   Yes Historical Provider, MD  feeding supplement, ENSURE ENLIVE, (ENSURE ENLIVE) LIQD Take 237 mLs by mouth 2 (two) times daily between meals.   Yes Historical Provider, MD  Fluticasone-Salmeterol (ADVAIR) 250-50 MCG/DOSE AEPB Inhale 1 puff into the lungs 2 (two) times daily. Rinse mouth after use.   Yes Historical Provider, MD  guaiFENesin (MUCINEX) 600 MG 12 hr tablet Take 600 mg by mouth 2 (two) times daily.   Yes Historical Provider, MD  lithium carbonate (LITHOBID) 300 MG CR tablet Take 300-600 mg by mouth 2 (two) times daily. 300 mg every morning and 600 mg every evening   Yes Historical Provider, MD  OLANZapine (ZYPREXA) 10 MG tablet Take 5-15 mg by mouth at bedtime. 5 mg every morning and 15 mg every evening.   Yes Historical Provider, MD  topiramate (TOPAMAX) 25 MG tablet Take 25-50 mg by mouth 2 (two) times daily. 25 mg every morning and 50 mg every evening.   Yes Historical Provider, MD    Physical Exam: Filed Vitals:   07/23/15 1900 07/23/15 1916 07/23/15 1940 07/23/15 2039  BP: 94/60 101/60 118/81 150/88  Pulse: 81 83 88 89  Temp:    98.9 F (37.2 C)  TempSrc:    Oral  Resp: Height:      Weight:      SpO2: 93% 95% 94% 98%     General:  Moderately built and nourished.  Eyes:  Anicteric no pallor.  ENT: No discharge from the ears eyes nose and mouth.  Neck: No mass felt. No JVD appreciated.  Cardiovascular: S1-S2 heard.  Respiratory: Bilateral expiratory wheezes heard.  Abdomen: Soft nontender bowel sounds present.  Skin: No rash.  Musculoskeletal: No edema.  Psychiatric: Appears normal.  Neurologic: Alert awake oriented to time place and person. Moves all extremities.  Labs on Admission:  Basic Metabolic Panel:  Recent Labs Lab 07/23/15 1710  NA 135  K 4.2  CL 106  CO2 17*  GLUCOSE  202*  BUN 9  CREATININE 1.11*  CALCIUM 9.3   Liver Function Tests:  Recent Labs Lab 07/23/15 1710  AST 25  ALT 13*  ALKPHOS 79  BILITOT 0.5  PROT 6.6  ALBUMIN 3.6   No results for input(s): LIPASE, AMYLASE in the last 168 hours. No results for input(s): AMMONIA in the last 168 hours. CBC:  Recent Labs Lab 07/23/15 1710  WBC 22.5*  NEUTROABS 18.9*  HGB 13.7  HCT 42.3  MCV 104.2*  PLT 232   Cardiac Enzymes: No results for input(s): CKTOTAL, CKMB, CKMBINDEX, TROPONINI in the last 168 hours.  BNP (last 3 results) No results for input(s): BNP in the last 8760 hours.  ProBNP (last 3 results) No results for input(s): PROBNP in the last 8760 hours.  CBG: No results for input(s): GLUCAP in the last 168 hours.  Radiological Exams on Admission: Dg Chest Port 1 View  07/23/2015   CLINICAL DATA:  Cough.  Shortness of breath.  Fever.  EXAM: PORTABLE CHEST - 1 VIEW  COMPARISON:  01/22/2007  FINDINGS: Tapering of the peripheral pulmonary vasculature favors emphysema. Atherosclerotic calcification of the aortic arch is present.  Heart size within normal limits. Mild symmetric interstitial accentuation of both lung bases.  IMPRESSION: 1. Emphysema. 2. Faint interstitial accentuation of both lung bases is technically nonspecific but could be a manifestation of drug reaction, subtle edema, or atypical infectious process.   Electronically Signed   By: Gaylyn Rong M.D.   On: 07/23/2015 17:57    EKG: Independently reviewed. Normal sinus rhythm with atrial premature complexes.  Assessment/Plan Principal Problem:   Sepsis Active Problems:   Pneumonia   COPD (chronic obstructive pulmonary disease)   Bipolar disorder   1. Sepsis secondary to pneumonia - patient at this time evidently on vancomycin and Zosyn for healthcare associated pneumonia. Check urine Legionella strep and HIV status. Continue with aggressive hydration and check a calcitonin levels and blood  cultures. 2. COPD with mild exacerbation - patient has been placed on albuterol and Atrovent nebulizer scheduled. Continue patient's Advair. If continues to wheeze and may need IV steroids. 3. Acute renal failure - probably from sepsis and mild hypotension. Continue to hydrate and follow metabolic panel. UA appears unremarkable. 4. Bipolar disorder - no acute issues. Check lithium levels. 5. Tobacco abuse - patient advised to quit tobacco use.  I have personally reviewed patient's chest x-ray and EKG.   DVT Prophylaxis Lovenox.  Code Status: Full code.  Family Communication: Discussed with patient.  Disposition Plan: Admit to inpatient.    Daxen Lanum N. [facility] Pager [phone].  If 7PM-7AM, please contact night-coverage [email or web address] Password [email or web address] 07/23/2015, 10:08 PM

## 2015-07-23 NOTE — ED Notes (Signed)
[facility] from [facility]. Call for febrile illness. EMS arrival found Pt tympanic 103.1, tylenol  given. Per Pt, cough, weakness x1 week. Has taken daily meds. A/O x4

## 2015-07-23 NOTE — ED Notes (Signed)
MD Gwendolyn Grant aware possible sepsis.

## 2015-07-24 ENCOUNTER — Encounter (HOSPITAL_COMMUNITY): Payer: Self-pay | Admitting: *Deleted

## 2015-07-24 DIAGNOSIS — A419 Sepsis, unspecified organism: Secondary | ICD-10-CM

## 2015-07-24 DIAGNOSIS — F319 Bipolar disorder, unspecified: Secondary | ICD-10-CM

## 2015-07-24 DIAGNOSIS — J440 Chronic obstructive pulmonary disease with acute lower respiratory infection: Secondary | ICD-10-CM

## 2015-07-24 DIAGNOSIS — J189 Pneumonia, unspecified organism: Principal | ICD-10-CM

## 2015-07-24 LAB — COMPREHENSIVE METABOLIC PANEL
ALBUMIN: 3 g/dL — AB (ref 3.5–5.0)
ALT: 15 U/L (ref 14–54)
AST: 24 U/L (ref 15–41)
Alkaline Phosphatase: 57 U/L (ref 38–126)
Anion gap: 6 (ref 5–15)
BUN: 8 mg/dL (ref 6–20)
CHLORIDE: 113 mmol/L — AB (ref 101–111)
CO2: 22 mmol/L (ref 22–32)
Calcium: 9 mg/dL (ref 8.9–10.3)
Creatinine, Ser: 1.01 mg/dL — ABNORMAL HIGH (ref 0.44–1.00)
GFR calc Af Amer: >60 mL/min (ref >60)
GFR calc non Af Amer: 58 mL/min — ABNORMAL LOW (ref >60)
GLUCOSE: 153 mg/dL — AB (ref 65–99)
POTASSIUM: 3.9 mmol/L (ref 3.5–5.1)
SODIUM: 141 mmol/L (ref 135–145)
Total Bilirubin: 0.5 mg/dL (ref 0.3–1.2)
Total Protein: 5.9 g/dL — ABNORMAL LOW (ref 6.5–8.1)

## 2015-07-24 LAB — CBC WITH DIFFERENTIAL/PLATELET
Basophils Absolute: 0 10*3/uL (ref 0.0–0.1)
Basophils Relative: 0 % (ref 0–1)
EOS PCT: 0 % (ref 0–5)
Eosinophils Absolute: 0 10*3/uL (ref 0.0–0.7)
HEMATOCRIT: 40.9 % (ref 36.0–46.0)
Hemoglobin: 13.2 g/dL (ref 12.0–15.0)
LYMPHS ABS: 0.9 10*3/uL (ref 0.7–4.0)
Lymphocytes Relative: 4 % — ABNORMAL LOW (ref 12–46)
MCH: 33.8 pg (ref 26.0–34.0)
MCHC: 32.3 g/dL (ref 30.0–36.0)
MCV: 104.9 fL — AB (ref 78.0–100.0)
MONOS PCT: 11 % (ref 3–12)
Monocytes Absolute: 2.4 10*3/uL — ABNORMAL HIGH (ref 0.1–1.0)
NEUTROS ABS: 18.1 10*3/uL — AB (ref 1.7–7.7)
Neutrophils Relative %: 85 % — ABNORMAL HIGH (ref 43–77)
Platelets: 223 10*3/uL (ref 150–400)
RBC: 3.9 MIL/uL (ref 3.87–5.11)
RDW: 13.7 % (ref 11.5–15.5)
WBC: 21.4 10*3/uL — ABNORMAL HIGH (ref 4.0–10.5)

## 2015-07-24 LAB — HIV ANTIBODY (ROUTINE TESTING W REFLEX): HIV SCREEN 4TH GENERATION: NONREACTIVE

## 2015-07-24 LAB — STREP PNEUMONIAE URINARY ANTIGEN: STREP PNEUMO URINARY ANTIGEN: NEGATIVE

## 2015-07-24 LAB — TROPONIN I
Troponin I: <0.03 ng/mL (ref <0.031)
Troponin I: <0.03 ng/mL (ref <0.031)

## 2015-07-24 LAB — GLUCOSE, CAPILLARY: GLUCOSE-CAPILLARY: 131 mg/dL — AB (ref 65–99)

## 2015-07-24 LAB — MRSA PCR SCREENING: MRSA by PCR: NEGATIVE

## 2015-07-24 MED ORDER — PNEUMOCOCCAL VAC POLYVALENT 25 MCG/0.5ML IJ INJ
0.5000 mL | INJECTION | INTRAMUSCULAR | Status: AC
  Administered 2015-07-25: 0.5 mL via INTRAMUSCULAR
  Filled 2015-07-24: qty 0.5

## 2015-07-24 MED ORDER — CETYLPYRIDINIUM CHLORIDE 0.05 % MT LIQD
7.0000 mL | Freq: Two times a day (BID) | OROMUCOSAL | Status: DC
  Administered 2015-07-24 – 2015-07-29 (×12): 7 mL via OROMUCOSAL

## 2015-07-24 NOTE — Care Management Note (Signed)
Case Management Note  Patient Details  Name: Mary Avila MRN: [id number] Date of Birth: Oct 01, 1951  Subjective/Objective:                 Patient admitted with sepsis from ALF [facility].    Action/Plan:  Anticipate discharge to [facility] when clinically appropriate.  Expected Discharge Date:                  Expected Discharge Plan:  Assisted Living / Rest Home  In-House Referral:  Clinical Social Work  Discharge planning Services  CM Consult  Post Acute Care Choice:    Choice offered to:  Patient  DME Arranged:    DME Agency:     HH Arranged:    HH Agency:     Status of Service:  In process, will continue to follow  Medicare Important Message Given:    Date Medicare IM Given:    Medicare IM give by:    Date Additional Medicare IM Given:    Additional Medicare Important Message give by:     If discussed at Long Length of Stay Meetings, dates discussed:    Additional Comments:  Lawerance Sabal, RN 07/24/2015, 12:07 PM

## 2015-07-24 NOTE — Progress Notes (Signed)
Patient Demographics  Mary Avila, is a 64 y.o. female, DOB - [birth year], ZOX:[id number]  Admit date - 07/23/2015   Admitting Physician Eduard Clos, MD  Outpatient Primary MD for the patient is No primary care provider on file.  LOS - 1   Chief Complaint  Patient presents with  . Fever       Admission HPI/Brief narrative:  Subjective:   Mary Avila today has, No headache, No chest pain, No abdominal pain - No Nausea, No new weakness tingling or numbness, complaints of cough and shortness of breath.  Assessment & Plan    Principal Problem:   Sepsis Active Problems:   Pneumonia   COPD (chronic obstructive pulmonary disease)   Bipolar disorder  Sepsis secondary to pneumonia - Continue with IV vancomycin and Zosyn for healthcare associated pneumonia - Follow on Legionella and HIV, strep P antigen negative - Follow on blood cultures - Start on pulmonary toilette, flutter valve and Mucinex  COPD - No active wheezing, continue with nebs, continue with Advair  Acute renal failure  - Continue to volume depletion and infection, continue with IV fluids  Bipolar disorder - And tinea with home medication including lithium and olanzapine,  Elevated blood glucose - No history of diabetes mellitus, check hemoglobin A1c, monitor CBG closely.   Code Status: Full  Family Communication: None at bedside  Disposition Plan: Home once stable   Procedures  None   Consults   None   Medications  Scheduled Meds: . albuterol  2.5 mg Nebulization Q4H  . antiseptic oral rinse  7 mL Mouth Rinse BID  . enoxaparin (LOVENOX) injection  40 mg Subcutaneous Q24H  . feeding supplement (ENSURE ENLIVE)  237 mL Oral BID BM  . guaiFENesin  600 mg Oral BID  . ipratropium  0.5 mg Nebulization Q4H  . lithium carbonate  300 mg Oral Daily  . lithium carbonate  600 mg Oral QHS  .  mometasone-formoterol  2 puff Inhalation BID  . OLANZapine  15 mg Oral QHS  . OLANZapine  5 mg Oral Daily  . piperacillin-tazobactam (ZOSYN)  IV  3.375 g Intravenous Q8H  . [START ON 07/25/2015] pneumococcal 23 valent vaccine  0.5 mL Intramuscular Tomorrow-1000  . topiramate  25 mg Oral Daily  . topiramate  50 mg Oral QHS  . vancomycin  750 mg Intravenous Q24H   Continuous Infusions: . sodium chloride 125 mL/hr at 07/24/15 0738   PRN Meds:.acetaminophen **OR** acetaminophen, albuterol, ondansetron **OR** ondansetron (ZOFRAN) IV  DVT Prophylaxis  Lovenox -  Lab Results  Component Value Date   PLT 223 07/24/2015    Antibiotics    Anti-infectives    Start     Dose/Rate Route Frequency Ordered Stop   07/24/15 1800  vancomycin (VANCOCIN) IVPB 750 mg/150 ml premix     750 mg 150 mL/hr over 60 Minutes Intravenous Every 24 hours 07/23/15 1914     07/24/15 0100  piperacillin-tazobactam (ZOSYN) IVPB 3.375 g     3.375 g 12.5 mL/hr over 240 Minutes Intravenous Every 8 hours 07/23/15 1914     07/23/15 2215  vancomycin (VANCOCIN) IVPB 1000 mg/200 mL premix  Status:  Discontinued     1,000 mg 200 mL/hr over 60 Minutes Intravenous  Once 07/23/15 2208 07/23/15 2210   07/23/15 2215  piperacillin-tazobactam (ZOSYN) IVPB 3.375 g  Status:  Discontinued     3.375 g 100 mL/hr over 30 Minutes Intravenous 3 times per day 07/23/15 2208 07/23/15 2210   07/23/15 1745  piperacillin-tazobactam (ZOSYN) IVPB 3.375 g     3.375 g 100 mL/hr over 30 Minutes Intravenous  Once 07/23/15 1732 07/23/15 1807   07/23/15 1730  vancomycin (VANCOCIN) IVPB 1000 mg/200 mL premix     1,000 mg 200 mL/hr over 60 Minutes Intravenous  Once 07/23/15 1727 07/23/15 1937   07/23/15 1730  piperacillin-tazobactam (ZOSYN) IVPB 3.375 g  Status:  Discontinued     3.375 g 100 mL/hr over 30 Minutes Intravenous 3 times per day 07/23/15 1727 07/23/15 1732          Objective:   Filed Vitals:   07/24/15 0433 07/24/15 0537  07/24/15 0921 07/24/15 1218  BP:  113/78    Pulse:  89    Temp:  99.4 F (37.4 C)    TempSrc:  Oral    Resp:  18    Height:      Weight:      SpO2: 95% 94% 97% 94%    Wt Readings from Last 3 Encounters:  07/23/15 47.174 kg (104 lb)  01/11/12 50.349 kg (111 lb)     Intake/Output Summary (Last 24 hours) at 07/24/15 1319 Last data filed at 07/24/15 1200  Gross per 24 hour  Intake 5291.25 ml  Output   3450 ml  Net 1841.25 ml     Physical Exam  Awake Alert, Oriented X 3, No new F.N deficits, Normal affect Mary Avila.AT,PERRAL Supple Neck,No JVD, No cervical lymphadenopathy appriciated.  Symmetrical Chest wall movement, Good air movement bilaterally, no wheezing RRR,No Gallops,Rubs or new Murmurs, No Parasternal Heave +ve B.Sounds, Abd Soft, No tenderness, No organomegaly appriciated, No rebound - guarding or rigidity. No Cyanosis, Clubbing or edema, No new Rash or bruise     Data Review   Micro Results Recent Results (from the past 240 hour(s))  Urine culture     Status: None (Preliminary result)   Collection Time: 07/23/15  7:40 PM  Result Value Ref Range Status   Specimen Description URINE, RANDOM  Final   Special Requests NONE  Final   Culture NO GROWTH < 24 HOURS  Final   Report Status PENDING  Incomplete  MRSA PCR Screening     Status: None   Collection Time: 07/24/15  6:02 AM  Result Value Ref Range Status   MRSA by PCR NEGATIVE NEGATIVE Final    Comment:        The GeneXpert MRSA Assay (FDA approved for NASAL specimens only), is one component of a comprehensive MRSA colonization surveillance program. It is not intended to diagnose MRSA infection nor to guide or monitor treatment for MRSA infections.     Radiology Reports Dg Chest Port 1 View  07/23/2015   CLINICAL DATA:  Cough.  Shortness of breath.  Fever.  EXAM: PORTABLE CHEST - 1 VIEW  COMPARISON:  01/22/2007  FINDINGS: Tapering of the peripheral pulmonary vasculature favors emphysema. Atherosclerotic  calcification of the aortic arch is present.  Heart size within normal limits. Mild symmetric interstitial accentuation of both lung bases.  IMPRESSION: 1. Emphysema. 2. Faint interstitial accentuation of both lung bases is technically nonspecific but could be a manifestation of drug reaction, subtle edema, or atypical infectious process.   Electronically Signed   By: Annitta Needs.D.  On: 07/23/2015 17:57     CBC  Recent Labs Lab 07/23/15 1710 07/23/15 2230 07/24/15 0356  WBC 22.5* 14.3* 21.4*  HGB 13.7 12.7 13.2  HCT 42.3 39.8 40.9  PLT 232 221 223  MCV 104.2* 103.6* 104.9*  MCH 33.7 33.1 33.8  MCHC 32.4 31.9 32.3  RDW 13.5 13.6 13.7  LYMPHSABS 1.1  --  0.9  MONOABS 2.5*  --  2.4*  EOSABS 0.0  --  0.0  BASOSABS 0.0  --  0.0    Chemistries   Recent Labs Lab 07/23/15 1710 07/23/15 2230 07/24/15 0356  NA 135  --  141  K 4.2  --  3.9  CL 106  --  113*  CO2 17*  --  22  GLUCOSE 202*  --  153*  BUN 9  --  8  CREATININE 1.11* 1.04* 1.01*  CALCIUM 9.3  --  9.0  AST 25  --  24  ALT 13*  --  15  ALKPHOS 79  --  57  BILITOT 0.5  --  0.5   ------------------------------------------------------------------------------------------------------------------ estimated creatinine clearance is 42.5 mL/min (by C-G formula based on Cr of 1.01). ------------------------------------------------------------------------------------------------------------------ No results for input(s): HGBA1C in the last 72 hours. ------------------------------------------------------------------------------------------------------------------ No results for input(s): CHOL, HDL, LDLCALC, TRIG, CHOLHDL, LDLDIRECT in the last 72 hours. ------------------------------------------------------------------------------------------------------------------ No results for input(s): TSH, T4TOTAL, T3FREE, THYROIDAB in the last 72 hours.  Invalid input(s):  FREET3 ------------------------------------------------------------------------------------------------------------------ No results for input(s): VITAMINB12, FOLATE, FERRITIN, TIBC, IRON, RETICCTPCT in the last 72 hours.  Coagulation profile No results for input(s): INR, PROTIME in the last 168 hours.  No results for input(s): DDIMER in the last 72 hours.  Cardiac Enzymes  Recent Labs Lab 07/23/15 2230 07/24/15 0356 07/24/15 1010  TROPONINI <0.03 <0.03 <0.03   ------------------------------------------------------------------------------------------------------------------ Invalid input(s): POCBNP     Time Spent in minutes   30 minutes   Kalyssa Anker M.D on 07/24/2015 at 1:19 PM  Between 7am to 7pm - Pager - [phone]  After 7pm go to [email or web address] - password [facility]  [facility]   Office  [phone]

## 2015-07-24 NOTE — Clinical Social Work Note (Signed)
CSW notes that patient is from [facility]. CSW unable to assess patient today. Report left for covering CSW.   Roddie Mc MSW, [place], [place], [phone]

## 2015-07-25 LAB — CBC
HCT: 34.4 % — ABNORMAL LOW (ref 36.0–46.0)
Hemoglobin: 10.8 g/dL — ABNORMAL LOW (ref 12.0–15.0)
MCH: 33.1 pg (ref 26.0–34.0)
MCHC: 31.4 g/dL (ref 30.0–36.0)
MCV: 105.5 fL — ABNORMAL HIGH (ref 78.0–100.0)
PLATELETS: 197 10*3/uL (ref 150–400)
RBC: 3.26 MIL/uL — AB (ref 3.87–5.11)
RDW: 14 % (ref 11.5–15.5)
WBC: 15.5 10*3/uL — ABNORMAL HIGH (ref 4.0–10.5)

## 2015-07-25 LAB — URINE CULTURE: Culture: NO GROWTH

## 2015-07-25 LAB — BASIC METABOLIC PANEL
Anion gap: 3 — ABNORMAL LOW (ref 5–15)
BUN: 9 mg/dL (ref 6–20)
CALCIUM: 8.6 mg/dL — AB (ref 8.9–10.3)
CO2: 22 mmol/L (ref 22–32)
CREATININE: 0.87 mg/dL (ref 0.44–1.00)
Chloride: 122 mmol/L — ABNORMAL HIGH (ref 101–111)
GFR calc Af Amer: >60 mL/min (ref >60)
GLUCOSE: 112 mg/dL — AB (ref 65–99)
POTASSIUM: 3.9 mmol/L (ref 3.5–5.1)
SODIUM: 147 mmol/L — AB (ref 135–145)

## 2015-07-25 LAB — LEGIONELLA ANTIGEN, URINE

## 2015-07-25 MED ORDER — SODIUM CHLORIDE 0.9 % IV SOLN: INTRAVENOUS | Status: DC

## 2015-07-25 MED ORDER — SODIUM CHLORIDE 0.9 % IV BOLUS (SEPSIS)
500.0000 mL | Freq: Once | INTRAVENOUS | Status: AC
  Administered 2015-07-25: 500 mL via INTRAVENOUS

## 2015-07-25 MED ORDER — IPRATROPIUM-ALBUTEROL 0.5-2.5 (3) MG/3ML IN SOLN
3.0000 mL | Freq: Four times a day (QID) | RESPIRATORY_TRACT | Status: DC
  Administered 2015-07-25 – 2015-07-29 (×13): 3 mL via RESPIRATORY_TRACT
  Filled 2015-07-25 (×14): qty 3

## 2015-07-25 MED ORDER — DEXTROSE-NACL 5-0.45 % IV SOLN
INTRAVENOUS | Status: AC
  Administered 2015-07-25: 09:00:00 via INTRAVENOUS

## 2015-07-25 NOTE — Progress Notes (Signed)
Patient Demographics  Mary Avila, is a 64 y.o. female, DOB - 05-08-1951, ZOX:[id number]  Admit date - 07/23/2015   Admitting Physician Eduard Clos, MD  Outpatient Primary MD for the patient is No primary care provider on file.  LOS - 2   Chief Complaint  Patient presents with  . Fever       Admission HPI/Brief narrative:  Subjective:   Mary Avila today has, No headache, No chest pain, No abdominal pain - No Nausea, No new weakness tingling or numbness, cough and shortness of breath are improving.  Assessment & Plan    Principal Problem:   Sepsis Active Problems:   Pneumonia   COPD (chronic obstructive pulmonary disease)   Bipolar disorder  Sepsis secondary to pneumonia - Continue with IV vancomycin and Zosyn for healthcare associated pneumonia -  Legionella , HIV and strep P antigen are negative -  blood cultures NGTD -  on pulmonary toilette, flutter valve and Mucinex  COPD - No active wheezing, continue with nebs, continue with Advair  Acute renal failure - due to volume depletion and infection,resolved  Hypernatremia - will start on IV D5 1/2 NS.  Bipolar disorder - And tinea with home medication including lithium and olanzapine,  Elevated blood glucose - No history of diabetes mellitus, check hemoglobin A1c, monitor CBG closely.   Code Status: Full  Family Communication: None at bedside  Disposition Plan: Home once stable   Procedures  None   Consults   None   Medications  Scheduled Meds: . albuterol  2.5 mg Nebulization Q4H  . antiseptic oral rinse  7 mL Mouth Rinse BID  . enoxaparin (LOVENOX) injection  40 mg Subcutaneous Q24H  . feeding supplement (ENSURE ENLIVE)  237 mL Oral BID BM  . guaiFENesin  600 mg Oral BID  . ipratropium  0.5 mg Nebulization Q4H  . lithium carbonate  300 mg Oral Daily  . lithium carbonate  600 mg Oral QHS  .  mometasone-formoterol  2 puff Inhalation BID  . OLANZapine  15 mg Oral QHS  . OLANZapine  5 mg Oral Daily  . piperacillin-tazobactam (ZOSYN)  IV  3.375 g Intravenous Q8H  . sodium chloride  500 mL Intravenous Once  . topiramate  25 mg Oral Daily  . topiramate  50 mg Oral QHS  . vancomycin  750 mg Intravenous Q24H   Continuous Infusions: . dextrose 5 % and 0.45% NaCl 75 mL/hr at 07/25/15 0835   PRN Meds:.acetaminophen **OR** acetaminophen, albuterol, ondansetron **OR** ondansetron (ZOFRAN) IV  DVT Prophylaxis  Lovenox -  Lab Results  Component Value Date   PLT 197 07/25/2015    Antibiotics    Anti-infectives    Start     Dose/Rate Route Frequency Ordered Stop   07/24/15 1800  vancomycin (VANCOCIN) IVPB 750 mg/150 ml premix     750 mg 150 mL/hr over 60 Minutes Intravenous Every 24 hours 07/23/15 1914     07/24/15 0100  piperacillin-tazobactam (ZOSYN) IVPB 3.375 g     3.375 g 12.5 mL/hr over 240 Minutes Intravenous Every 8 hours 07/23/15 1914     07/23/15 2215  vancomycin (VANCOCIN) IVPB 1000 mg/200 mL premix  Status:  Discontinued     1,000 mg 200 mL/hr  over 60 Minutes Intravenous  Once 07/23/15 2208 07/23/15 2210   07/23/15 2215  piperacillin-tazobactam (ZOSYN) IVPB 3.375 g  Status:  Discontinued     3.375 g 100 mL/hr over 30 Minutes Intravenous 3 times per day 07/23/15 2208 07/23/15 2210   07/23/15 1745  piperacillin-tazobactam (ZOSYN) IVPB 3.375 g     3.375 g 100 mL/hr over 30 Minutes Intravenous  Once 07/23/15 1732 07/23/15 1807   07/23/15 1730  vancomycin (VANCOCIN) IVPB 1000 mg/200 mL premix     1,000 mg 200 mL/hr over 60 Minutes Intravenous  Once 07/23/15 1727 07/23/15 1937   07/23/15 1730  piperacillin-tazobactam (ZOSYN) IVPB 3.375 g  Status:  Discontinued     3.375 g 100 mL/hr over 30 Minutes Intravenous 3 times per day 07/23/15 1727 07/23/15 1732          Objective:   Filed Vitals:   07/25/15 0651 07/25/15 0741 07/25/15 1206 07/25/15 1431  BP: 117/75    98/51  Pulse: 84   88  Temp: 97.3 F (36.3 C)   99.2 F (37.3 C)  TempSrc: Oral   Oral  Resp: 21   18  Height:      Weight:      SpO2: 98% 96% 97% 99%    Wt Readings from Last 3 Encounters:  07/23/15 47.174 kg (104 lb)  01/11/12 50.349 kg (111 lb)     Intake/Output Summary (Last 24 hours) at 07/25/15 1558 Last data filed at 07/25/15 1334  Gross per 24 hour  Intake   2650 ml  Output  82956 ml  Net  -7700 ml     Physical Exam  Awake Alert, Oriented X 3, No new F.N deficits, Normal affect [place].AT,PERRAL Supple Neck,No JVD, No cervical lymphadenopathy appriciated.  Symmetrical Chest wall movement, Good air movement bilaterally, no wheezing RRR,No Gallops,Rubs or new Murmurs, No Parasternal Heave +ve B.Sounds, Abd Soft, No tenderness, No organomegaly appriciated, No rebound - guarding or rigidity. No Cyanosis, Clubbing or edema, No new Rash or bruise     Data Review   Micro Results Recent Results (from the past 240 hour(s))  Culture, blood (routine x 2)     Status: None (Preliminary result)   Collection Time: 07/23/15  5:10 PM  Result Value Ref Range Status   Specimen Description BLOOD LEFT ARM  Final   Special Requests BOTTLES DRAWN AEROBIC AND ANAEROBIC  Final   Culture NO GROWTH 2 DAYS  Final   Report Status PENDING  Incomplete  Culture, blood (routine x 2)     Status: None (Preliminary result)   Collection Time: 07/23/15  5:35 PM  Result Value Ref Range Status   Specimen Description BLOOD RIGHT ARM  Final   Special Requests BOTTLES DRAWN AEROBIC AND ANAEROBIC 5CC  Final   Culture NO GROWTH 2 DAYS  Final   Report Status PENDING  Incomplete  Urine culture     Status: None   Collection Time: 07/23/15  7:40 PM  Result Value Ref Range Status   Specimen Description URINE, RANDOM  Final   Special Requests NONE  Final   Culture NO GROWTH 2 DAYS  Final   Report Status 07/25/2015 FINAL  Final  MRSA PCR Screening     Status: None   Collection Time: 07/24/15  6:02  AM  Result Value Ref Range Status   MRSA by PCR NEGATIVE NEGATIVE Final    Comment:        The GeneXpert MRSA Assay (FDA approved for NASAL specimens  only), is one component of a comprehensive MRSA colonization surveillance program. It is not intended to diagnose MRSA infection nor to guide or monitor treatment for MRSA infections.     Radiology Reports Dg Chest Port 1 View  07/23/2015   CLINICAL DATA:  Cough.  Shortness of breath.  Fever.  EXAM: PORTABLE CHEST - 1 VIEW  COMPARISON:  01/22/2007  FINDINGS: Tapering of the peripheral pulmonary vasculature favors emphysema. Atherosclerotic calcification of the aortic arch is present.  Heart size within normal limits. Mild symmetric interstitial accentuation of both lung bases.  IMPRESSION: 1. Emphysema. 2. Faint interstitial accentuation of both lung bases is technically nonspecific but could be a manifestation of drug reaction, subtle edema, or atypical infectious process.   Electronically Signed   By: Gaylyn Rong M.D.   On: 07/23/2015 17:57     CBC  Recent Labs Lab 07/23/15 1710 07/23/15 2230 07/24/15 0356 07/25/15 0607  WBC 22.5* 14.3* 21.4* 15.5*  HGB 13.7 12.7 13.2 10.8*  HCT 42.3 39.8 40.9 34.4*  PLT 232 221 223 197  MCV 104.2* 103.6* 104.9* 105.5*  MCH 33.7 33.1 33.8 33.1  MCHC 32.4 31.9 32.3 31.4  RDW 13.5 13.6 13.7 14.0  LYMPHSABS 1.1  --  0.9  --   MONOABS 2.5*  --  2.4*  --   EOSABS 0.0  --  0.0  --   BASOSABS 0.0  --  0.0  --     Chemistries   Recent Labs Lab 07/23/15 1710 07/23/15 2230 07/24/15 0356 07/25/15 0607  NA 135  --  141 147*  K 4.2  --  3.9 3.9  CL 106  --  113* 122*  CO2 17*  --  22 22  GLUCOSE 202*  --  153* 112*  BUN 9  --  8 9  CREATININE 1.11* 1.04* 1.01* 0.87  CALCIUM 9.3  --  9.0 8.6*  AST 25  --  24  --   ALT 13*  --  15  --   ALKPHOS 79  --  57  --   BILITOT 0.5  --  0.5  --     ------------------------------------------------------------------------------------------------------------------ estimated creatinine clearance is 49.3 mL/min (by C-G formula based on Cr of 0.87). ------------------------------------------------------------------------------------------------------------------ No results for input(s): HGBA1C in the last 72 hours. ------------------------------------------------------------------------------------------------------------------ No results for input(s): CHOL, HDL, LDLCALC, TRIG, CHOLHDL, LDLDIRECT in the last 72 hours. ------------------------------------------------------------------------------------------------------------------ No results for input(s): TSH, T4TOTAL, T3FREE, THYROIDAB in the last 72 hours.  Invalid input(s): FREET3 ------------------------------------------------------------------------------------------------------------------ No results for input(s): VITAMINB12, FOLATE, FERRITIN, TIBC, IRON, RETICCTPCT in the last 72 hours.  Coagulation profile No results for input(s): INR, PROTIME in the last 168 hours.  No results for input(s): DDIMER in the last 72 hours.  Cardiac Enzymes  Recent Labs Lab 07/23/15 2230 07/24/15 0356 07/24/15 1010  TROPONINI <0.03 <0.03 <0.03   ------------------------------------------------------------------------------------------------------------------ Invalid input(s): POCBNP     Time Spent in minutes   30 minutes   Darral Rishel M.D on 07/25/2015 at 3:58 PM  Between 7am to 7pm - Pager - [phone]  After 7pm go to [email or web address] - password [facility]  [facility]   Office  [phone]

## 2015-07-26 DIAGNOSIS — N251 Nephrogenic diabetes insipidus: Secondary | ICD-10-CM

## 2015-07-26 LAB — BASIC METABOLIC PANEL
Anion gap: 6 (ref 5–15)
BUN: <5 mg/dL — ABNORMAL LOW (ref 6–20)
CO2: 22 mmol/L (ref 22–32)
Calcium: 8.6 mg/dL — ABNORMAL LOW (ref 8.9–10.3)
Chloride: 117 mmol/L — ABNORMAL HIGH (ref 101–111)
Creatinine, Ser: 0.88 mg/dL (ref 0.44–1.00)
GFR calc Af Amer: >60 mL/min (ref >60)
GFR calc non Af Amer: >60 mL/min (ref >60)
GLUCOSE: 119 mg/dL — AB (ref 65–99)
Potassium: 3.8 mmol/L (ref 3.5–5.1)
Sodium: 145 mmol/L (ref 135–145)

## 2015-07-26 LAB — CBC
HEMATOCRIT: 35.4 % — AB (ref 36.0–46.0)
HEMOGLOBIN: 11 g/dL — AB (ref 12.0–15.0)
MCH: 33 pg (ref 26.0–34.0)
MCHC: 31.1 g/dL (ref 30.0–36.0)
MCV: 106.3 fL — AB (ref 78.0–100.0)
PLATELETS: 204 10*3/uL (ref 150–400)
RBC: 3.33 MIL/uL — AB (ref 3.87–5.11)
RDW: 14.2 % (ref 11.5–15.5)
WBC: 12.8 10*3/uL — AB (ref 4.0–10.5)

## 2015-07-26 LAB — OSMOLALITY: Osmolality: 302 mOsm/kg — ABNORMAL HIGH (ref 275–300)

## 2015-07-26 LAB — OSMOLALITY, URINE: Osmolality, Ur: 88 mOsm/kg — ABNORMAL LOW (ref 390–1090)

## 2015-07-26 LAB — SODIUM, URINE, RANDOM: Sodium, Ur: 29 mmol/L

## 2015-07-26 MED ORDER — DEXTROSE-NACL 5-0.45 % IV SOLN
INTRAVENOUS | Status: AC
  Administered 2015-07-26 – 2015-07-27 (×2): via INTRAVENOUS

## 2015-07-26 MED ORDER — VALPROIC ACID 250 MG PO CAPS
500.0000 mg | ORAL_CAPSULE | Freq: Two times a day (BID) | ORAL | Status: DC
  Administered 2015-07-26 – 2015-07-29 (×6): 500 mg via ORAL
  Filled 2015-07-26 (×7): qty 2

## 2015-07-26 NOTE — Evaluation (Signed)
Physical Therapy Evaluation Patient Details Name: Mary Avila MRN: [id number] DOB: 1951-06-26 Today's Date: 07/26/2015   History of Present Illness  Patient is a 64 y/o female who presents with COPD, bipolar disorder, schizoaffective disorder and ongoing tobacco abuse was brought from the ALF due to increasing chest congestion, SOB and productive cough with fever and chills. Chest x-ray shows possible infiltrates.   Clinical Impression  Patient presents with decreased cardiovascular endurance and dyspnea on exertion impacting mobility. Tolerated ambulation with Min guard assist however drop in oxygen saturation to 86% on RA noted. Education re: pursed lip breathing, relaxation techniques, energy conservation techniques to assist with anxiety and ventilation. Instructed pt on incentive spirometer. Encouraged ambulation daily to improve strength, balance/mobility. Will follow acutely.   Follow Up Recommendations No PT follow up;Supervision - Intermittent    Equipment Recommendations  None recommended by PT    Recommendations for Other Services       Precautions / Restrictions Precautions Precautions: Fall Precaution Comments: monitor 02 Restrictions Weight Bearing Restrictions: No      Mobility  Bed Mobility Overal bed mobility: Modified Independent                Transfers Overall transfer level: Needs assistance Equipment used: None Transfers: Sit to/from Stand Sit to Stand: Supervision         General transfer comment: Supervision for safety.   Ambulation/Gait Ambulation/Gait assistance: Min guard Ambulation Distance (Feet): 200 Feet Assistive device: None Gait Pattern/deviations: Step-through pattern;Decreased stride length;Drifts right/left     General Gait Details: Pt with mostly steady gait with 1 instance of drifting left but no overt LOB. Dyspnea present. Sa02 decreased to 86% on RA. Increased RR noted. Cues for pursed lip breathing to decrease rate  and anxiety.   Stairs            Wheelchair Mobility    Modified Rankin (Stroke Patients Only)       Balance Overall balance assessment: Needs assistance Sitting-balance support: Feet supported;No upper extremity supported Sitting balance-Leahy Scale: Good     Standing balance support: During functional activity Standing balance-Leahy Scale: Fair                               Pertinent Vitals/Pain Pain Assessment: No/denies pain    Home Living Family/patient expects to be discharged to:: Assisted living [facility] )               Home Equipment: None      Prior Function Level of Independence: Independent         Comments: Pt uses bus for transportation to get coffee and for "pan handling."     Hand Dominance        Extremity/Trunk Assessment   Upper Extremity Assessment: Defer to OT evaluation           Lower Extremity Assessment: Generalized weakness         Communication   Communication: No difficulties  Cognition Arousal/Alertness: Awake/alert Behavior During Therapy: WFL for tasks assessed/performed;Impulsive Overall Cognitive Status: Within Functional Limits for tasks assessed                      General Comments General comments (skin integrity, edema, etc.): Gave pt incentive spirometer and instructed pt on use. RN notified.     Exercises        Assessment/Plan    PT Assessment Patient needs continued  PT services  PT Diagnosis Difficulty walking   PT Problem List Decreased strength;Cardiopulmonary status limiting activity;Decreased balance;Decreased mobility  PT Treatment Interventions Balance training;Gait training;Therapeutic activities;Therapeutic exercise;Patient/family education   PT Goals (Current goals can be found in the Care Plan section) Acute Rehab PT Goals Patient Stated Goal: to get up and walk! PT Goal Formulation: With patient Time For Goal Achievement: 08/09/15 Potential to  Achieve Goals: Good    Frequency Min 3X/week   Barriers to discharge Decreased caregiver support      Co-evaluation               End of Session Equipment Utilized During Treatment: Gait belt Activity Tolerance: Patient tolerated treatment well;Treatment limited secondary to medical complications (Comment) (decrease in sa02 with mobility.) Patient left: in bed;with call bell/phone within reach;with nursing/sitter in room Nurse Communication: Mobility status         Time: 1610-9604 PT Time Calculation (min) (ACUTE ONLY): 20 min   Charges:   PT Evaluation $Initial PT Evaluation Tier I: 1 Procedure     PT G Codes:        Cari Burgo A Breena Bevacqua 07/26/2015, 3:06 PM  Mylo Red, PT, DPT [phone]

## 2015-07-26 NOTE — Clinical Social Work Note (Signed)
CSW attempted to reach the patient's guardian today to complete assessment. Unfortunately CSW was unable to reach [facility]. CSW will leave report for weekend CSW.  Roddie Mc MSW, [place], [place], [phone]

## 2015-07-26 NOTE — Progress Notes (Signed)
Patient Demographics  Mary Avila, is a 64 y.o. female, DOB - 1951-08-05, ZOX:[id number]  Admit date - 07/23/2015   Admitting Physician Eduard Clos, MD  Outpatient Primary MD for the patient is No primary care provider on file.  LOS - 3   Chief Complaint  Patient presents with  . Fever         Subjective:   [name] today has, No headache, No chest pain, No abdominal pain - No Nausea, No new weakness tingling or numbness, cough and shortness of breath are improving, a shunt has significant urinary output yesterday, and reports she is constantly thirsty.  Assessment & Plan    Principal Problem:   Sepsis Active Problems:   Pneumonia   COPD (chronic obstructive pulmonary disease)   Bipolar disorder  Sepsis secondary to pneumonia - Treated with IV vancomycin and Zosyn for healthcare associated pneumonia, given negative blood cultures, will stop IV vancomycin, continue with Zosyn -  Legionella , HIV and strep P antigen are negative -  blood cultures NGTD -  on pulmonary toilette, flutter valve and Mucinex  COPD - No active wheezing, continue with nebs, continue with Advair  Acute renal failure - due to volume depletion and infection,resolved  Hypernatremia  - Resolved with D5 half-normal saline .  Nephrogenic Diabetes insipidus  - Patient with significant urine output(10 L yesterday), has low urine osmolality, and elevated sodium osmolality, picture compatible with nephrogenic diabetes insipidus. - Nephrology consult appreciated - We'll discontinue lithium - Patient has free access to water and liquids  Bipolar disorder - Will start patient lithium giving her nephrogenic diabetes insipidus, will start her on Depakote 500 mg twice a day instead as discussed with inpatient psychiatrically Dr. Shela Commons, will continue with olanzapine .  Elevated blood glucose - No history of  diabetes mellitus, check hemoglobin A1c, monitor CBG closely.   Code Status: Full  Family Communication: None at bedside  Disposition Plan: Home once stable   Procedures  None   Consults   None   Medications  Scheduled Meds: . antiseptic oral rinse  7 mL Mouth Rinse BID  . enoxaparin (LOVENOX) injection  40 mg Subcutaneous Q24H  . feeding supplement (ENSURE ENLIVE)  237 mL Oral BID BM  . guaiFENesin  600 mg Oral BID  . ipratropium-albuterol  3 mL Nebulization Q6H  . mometasone-formoterol  2 puff Inhalation BID  . OLANZapine  15 mg Oral QHS  . OLANZapine  5 mg Oral Daily  . piperacillin-tazobactam (ZOSYN)  IV  3.375 g Intravenous Q8H  . topiramate  25 mg Oral Daily  . topiramate  50 mg Oral QHS   Continuous Infusions: . dextrose 5 % and 0.45% NaCl 75 mL/hr at 07/26/15 0808   PRN Meds:.acetaminophen **OR** acetaminophen, albuterol, ondansetron **OR** ondansetron (ZOFRAN) IV  DVT Prophylaxis  Lovenox -  Lab Results  Component Value Date   PLT 204 07/26/2015    Antibiotics    Anti-infectives    Start     Dose/Rate Route Frequency Ordered Stop   07/24/15 1800  vancomycin (VANCOCIN) IVPB 750 mg/150 ml premix  Status:  Discontinued     750 mg 150 mL/hr over 60 Minutes Intravenous Every 24 hours 07/23/15 1914 07/26/15 0740   07/24/15  0100  piperacillin-tazobactam (ZOSYN) IVPB 3.375 g     3.375 g 12.5 mL/hr over 240 Minutes Intravenous Every 8 hours 07/23/15 1914     07/23/15 2215  vancomycin (VANCOCIN) IVPB 1000 mg/200 mL premix  Status:  Discontinued     1,000 mg 200 mL/hr over 60 Minutes Intravenous  Once 07/23/15 2208 07/23/15 2210   07/23/15 2215  piperacillin-tazobactam (ZOSYN) IVPB 3.375 g  Status:  Discontinued     3.375 g 100 mL/hr over 30 Minutes Intravenous 3 times per day 07/23/15 2208 07/23/15 2210   07/23/15 1745  piperacillin-tazobactam (ZOSYN) IVPB 3.375 g     3.375 g 100 mL/hr over 30 Minutes Intravenous  Once 07/23/15 1732 07/23/15 1807    07/23/15 1730  vancomycin (VANCOCIN) IVPB 1000 mg/200 mL premix     1,000 mg 200 mL/hr over 60 Minutes Intravenous  Once 07/23/15 1727 07/23/15 1937   07/23/15 1730  piperacillin-tazobactam (ZOSYN) IVPB 3.375 g  Status:  Discontinued     3.375 g 100 mL/hr over 30 Minutes Intravenous 3 times per day 07/23/15 1727 07/23/15 1732          Objective:   Filed Vitals:   07/25/15 2041 07/25/15 2119 07/26/15 0602 07/26/15 1453  BP:  145/81 109/52 118/94  Pulse:  99 86 92  Temp:  98.7 F (37.1 C) 98.8 F (37.1 C) 98.7 F (37.1 C)  TempSrc:  Oral Oral Oral  Resp:  18 18 18   Height:      Weight:      SpO2: 98% 96% 98% 97%    Wt Readings from Last 3 Encounters:  07/23/15 47.174 kg (104 lb)  01/11/12 50.349 kg (111 lb)     Intake/Output Summary (Last 24 hours) at 07/26/15 1650 Last data filed at 07/26/15 1343  Gross per 24 hour  Intake 1826.25 ml  Output   8800 ml  Net -6973.75 ml     Physical Exam  Awake Alert, Oriented X 3, No new F.N deficits, Normal affect [place].AT,PERRAL Supple Neck,No JVD, No cervical lymphadenopathy appriciated.  Symmetrical Chest wall movement, Good air movement bilaterally, no wheezing RRR,No Gallops,Rubs or new Murmurs, No Parasternal Heave +ve B.Sounds, Abd Soft, No tenderness, No organomegaly appriciated, No rebound - guarding or rigidity. No Cyanosis, Clubbing or edema, No new Rash or bruise     Data Review   Micro Results Recent Results (from the past 240 hour(s))  Culture, blood (routine x 2)     Status: None (Preliminary result)   Collection Time: 07/23/15  5:10 PM  Result Value Ref Range Status   Specimen Description BLOOD LEFT ARM  Final   Special Requests BOTTLES DRAWN AEROBIC AND ANAEROBIC  Final   Culture NO GROWTH 3 DAYS  Final   Report Status PENDING  Incomplete  Culture, blood (routine x 2)     Status: None (Preliminary result)   Collection Time: 07/23/15  5:35 PM  Result Value Ref Range Status   Specimen Description  BLOOD RIGHT ARM  Final   Special Requests BOTTLES DRAWN AEROBIC AND ANAEROBIC 5CC  Final   Culture NO GROWTH 3 DAYS  Final   Report Status PENDING  Incomplete  Urine culture     Status: None   Collection Time: 07/23/15  7:40 PM  Result Value Ref Range Status   Specimen Description URINE, RANDOM  Final   Special Requests NONE  Final   Culture NO GROWTH 2 DAYS  Final   Report Status 07/25/2015 FINAL  Final  MRSA PCR Screening     Status: None   Collection Time: 07/24/15  6:02 AM  Result Value Ref Range Status   MRSA by PCR NEGATIVE NEGATIVE Final    Comment:        The GeneXpert MRSA Assay (FDA approved for NASAL specimens only), is one component of a comprehensive MRSA colonization surveillance program. It is not intended to diagnose MRSA infection nor to guide or monitor treatment for MRSA infections.     Radiology Reports Dg Chest Port 1 View  07/23/2015   CLINICAL DATA:  Cough.  Shortness of breath.  Fever.  EXAM: PORTABLE CHEST - 1 VIEW  COMPARISON:  01/22/2007  FINDINGS: Tapering of the peripheral pulmonary vasculature favors emphysema. Atherosclerotic calcification of the aortic arch is present.  Heart size within normal limits. Mild symmetric interstitial accentuation of both lung bases.  IMPRESSION: 1. Emphysema. 2. Faint interstitial accentuation of both lung bases is technically nonspecific but could be a manifestation of drug reaction, subtle edema, or atypical infectious process.   Electronically Signed   By: Gaylyn Rong M.D.   On: 07/23/2015 17:57     CBC  Recent Labs Lab 07/23/15 1710 07/23/15 2230 07/24/15 0356 07/25/15 0607 07/26/15 0528  WBC 22.5* 14.3* 21.4* 15.5* 12.8*  HGB 13.7 12.7 13.2 10.8* 11.0*  HCT 42.3 39.8 40.9 34.4* 35.4*  PLT 232 221 223 197 204  MCV 104.2* 103.6* 104.9* 105.5* 106.3*  MCH 33.7 33.1 33.8 33.1 33.0  MCHC 32.4 31.9 32.3 31.4 31.1  RDW 13.5 13.6 13.7 14.0 14.2  LYMPHSABS 1.1  --  0.9  --   --   MONOABS 2.5*  --  2.4*   --   --   EOSABS 0.0  --  0.0  --   --   BASOSABS 0.0  --  0.0  --   --     Chemistries   Recent Labs Lab 07/23/15 1710 07/23/15 2230 07/24/15 0356 07/25/15 0607 07/26/15 0528  NA 135  --  141 147* 145  K 4.2  --  3.9 3.9 3.8  CL 106  --  113* 122* 117*  CO2 17*  --  22 22 22   GLUCOSE 202*  --  153* 112* 119*  BUN 9  --  8 9 <5*  CREATININE 1.11* 1.04* 1.01* 0.87 0.88  CALCIUM 9.3  --  9.0 8.6* 8.6*  AST 25  --  24  --   --   ALT 13*  --  15  --   --   ALKPHOS 79  --  57  --   --   BILITOT 0.5  --  0.5  --   --    ------------------------------------------------------------------------------------------------------------------ estimated creatinine clearance is 48.8 mL/min (by C-G formula based on Cr of 0.88). ------------------------------------------------------------------------------------------------------------------ No results for input(s): HGBA1C in the last 72 hours. ------------------------------------------------------------------------------------------------------------------ No results for input(s): CHOL, HDL, LDLCALC, TRIG, CHOLHDL, LDLDIRECT in the last 72 hours. ------------------------------------------------------------------------------------------------------------------ No results for input(s): TSH, T4TOTAL, T3FREE, THYROIDAB in the last 72 hours.  Invalid input(s): FREET3 ------------------------------------------------------------------------------------------------------------------ No results for input(s): VITAMINB12, FOLATE, FERRITIN, TIBC, IRON, RETICCTPCT in the last 72 hours.  Coagulation profile No results for input(s): INR, PROTIME in the last 168 hours.  No results for input(s): DDIMER in the last 72 hours.  Cardiac Enzymes  Recent Labs Lab 07/23/15 2230 07/24/15 0356 07/24/15 1010  TROPONINI <0.03 <0.03 <0.03    ------------------------------------------------------------------------------------------------------------------ Invalid input(s): POCBNP     Time Spent in minutes   30  minutes   Glenette Bookwalter M.D on 07/26/2015 at 4:50 PM  Between 7am to 7pm - Pager - [phone]  After 7pm go to [email or web address] - password [facility]  [facility]   Office  [phone]

## 2015-07-26 NOTE — Consult Note (Signed)
Mary Avila is a 64 y.o. female with history of COPD, bipolar disorder and ongoing tobacco abuse was brought from the assisted living facility after patient was found to have increasing chest congestion shortness of breath and productive cough with fever and chills. Chest x-ray shows possible infiltrates. On exam patient is wheezing bilaterally. Patient was given fluid bolus for sepsis and antibiotics were started for healthcare associated pneumonia. She has been receiving Lithium for years and noted to have 10 liters of urine output.  Sodium is 145 today.  SHe has polydipsia longstanding. Urine is dilute by UA. We were asked to evaluate the large UOP.  Past Medical History  Diagnosis Date  . Schizoaffective disorder   . Personality disorder   . Osteopenia   . Bipolar disorder   . Weakness   . COPD (chronic obstructive pulmonary disease)    Past Surgical History  Procedure Laterality Date  . Right wrist surgery     Social History:  reports that she has been smoking Cigarettes.  She has been smoking about 2.50 packs per day. She has never used smokeless tobacco. She reports that she drinks alcohol. She reports that she does not use illicit drugs. Allergies: No Known Allergies Family History  Problem Relation Age of Onset  . COPD Father     Medications:  Scheduled: . antiseptic oral rinse  7 mL Mouth Rinse BID  . enoxaparin (LOVENOX) injection  40 mg Subcutaneous Q24H  . feeding supplement (ENSURE ENLIVE)  237 mL Oral BID BM  . guaiFENesin  600 mg Oral BID  . ipratropium-albuterol  3 mL Nebulization Q6H  . lithium carbonate  600 mg Oral QHS  . mometasone-formoterol  2 puff Inhalation BID  . OLANZapine  15 mg Oral QHS  . OLANZapine  5 mg Oral Daily  . piperacillin-tazobactam (ZOSYN)  IV  3.375 g Intravenous Q8H  . topiramate  25 mg Oral Daily  . topiramate  50 mg Oral QHS   ROS: as per HPI otherwise noncontrib to renal prob   :Blood pressure 118/94, pulse 92, temperature  98.7 F (37.1 C), temperature source Oral, resp. rate 18, height 5' 2.75" (1.594 m), weight 47.174 kg (104 lb), SpO2 97 %.    General appearance: alert and cooperative Head: Normocephalic, without obvious abnormality, atraumatic Eyes: conjunctivae/corneas clear. PERRL, EOM's intact. Fundi benign. Ears: normal TM's and external ear canals both ears Nose: Nares normal. Septum midline. Mucosa normal. No drainage or sinus tenderness. Throat: lips, mucosa, and tongue normal; teeth and gums normal Resp: clear to auscultation bilaterally Chest wall: no tenderness Cardio: regular rate and rhythm, S1, S2 normal, no murmur, click, rub or gallop GI: soft, non-tender; bowel sounds normal; no masses,  no organomegaly Extremities: extremities normal, atraumatic, no cyanosis or edema Skin: Skin color, texture, turgor normal. No rashes or lesions Neurologic: Grossly normal Results for orders placed or performed during the hospital encounter of 07/23/15 (from the past 48 hour(s))  Glucose, capillary     Status: Abnormal   Collection Time: 07/24/15 10:24 PM  Result Value Ref Range   Glucose-Capillary 131 (H) 65 - 99 mg/dL  CBC     Status: Abnormal   Collection Time: 07/25/15  6:07 AM  Result Value Ref Range   WBC 15.5 (H) 4.0 - 10.5 K/uL   RBC 3.26 (L) 3.87 - 5.11 MIL/uL   Hemoglobin 10.8 (L) 12.0 - 15.0 g/dL   HCT 34.4 (L) 36.0 - 46.0 %   MCV 105.5 (H) 78.0 - 100.0  fL   MCH 33.1 26.0 - 34.0 pg   MCHC 31.4 30.0 - 36.0 g/dL   RDW 14.0 11.5 - 15.5 %   Platelets 197 150 - 400 K/uL  Basic metabolic panel     Status: Abnormal   Collection Time: 07/25/15  6:07 AM  Result Value Ref Range   Sodium 147 (H) 135 - 145 mmol/L   Potassium 3.9 3.5 - 5.1 mmol/L   Chloride 122 (H) 101 - 111 mmol/L   CO2 22 22 - 32 mmol/L   Glucose, Bld 112 (H) 65 - 99 mg/dL   BUN 9 6 - 20 mg/dL   Creatinine, Ser 0.87 0.44 - 1.00 mg/dL   Calcium 8.6 (L) 8.9 - 10.3 mg/dL   GFR calc non Af Amer >60 >60 mL/min   GFR calc Af  Amer >60 >60 mL/min    Comment: (NOTE) The eGFR has been calculated using the CKD EPI equation. This calculation has not been validated in all clinical situations. eGFR's persistently <60 mL/min signify possible Chronic Kidney Disease.    Anion gap 3 (L) 5 - 15  Basic metabolic panel     Status: Abnormal   Collection Time: 07/26/15  5:28 AM  Result Value Ref Range   Sodium 145 135 - 145 mmol/L   Potassium 3.8 3.5 - 5.1 mmol/L   Chloride 117 (H) 101 - 111 mmol/L   CO2 22 22 - 32 mmol/L   Glucose, Bld 119 (H) 65 - 99 mg/dL   BUN <5 (L) 6 - 20 mg/dL   Creatinine, Ser 0.88 0.44 - 1.00 mg/dL   Calcium 8.6 (L) 8.9 - 10.3 mg/dL   GFR calc non Af Amer >60 >60 mL/min   GFR calc Af Amer >60 >60 mL/min    Comment: (NOTE) The eGFR has been calculated using the CKD EPI equation. This calculation has not been validated in all clinical situations. eGFR's persistently <60 mL/min signify possible Chronic Kidney Disease.    Anion gap 6 5 - 15  CBC     Status: Abnormal   Collection Time: 07/26/15  5:28 AM  Result Value Ref Range   WBC 12.8 (H) 4.0 - 10.5 K/uL   RBC 3.33 (L) 3.87 - 5.11 MIL/uL   Hemoglobin 11.0 (L) 12.0 - 15.0 g/dL   HCT 35.4 (L) 36.0 - 46.0 %   MCV 106.3 (H) 78.0 - 100.0 fL   MCH 33.0 26.0 - 34.0 pg   MCHC 31.1 30.0 - 36.0 g/dL   RDW 14.2 11.5 - 15.5 %   Platelets 204 150 - 400 K/uL  Sodium, urine, random     Status: None   Collection Time: 07/26/15  1:44 PM  Result Value Ref Range   Sodium, Ur 29 mmol/L   No results found.  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

## 2015-07-26 NOTE — Progress Notes (Signed)
ANTIBIOTIC CONSULT NOTE - Follow-Up  Pharmacy Consult for vancomycin and zosyn Indication: rule out sepsis  No Known Allergies  Patient Measurements: Height: 5' 2.75" (159.4 cm) Weight: 104 lb (47.174 kg) IBW/kg (Calculated) : 51.83 Adjusted Body Weight:   Vital Signs: Temp: 98.8 F (37.1 C) (08/19 0602) Temp Source: Oral (08/19 0602) BP: 109/52 mmHg (08/19 0602) Pulse Rate: 86 (08/19 0602) Intake/Output from previous day: 08/18 0701 - 08/19 0700 In: 1726.3 [P.O.:720; I.V.:956.3; IV Piggyback:50] Out: 9300 [Urine:9300] Intake/Output from this shift: Total I/O In: -  Out: 2000 [Urine:2000]  Labs:  Recent Labs  07/24/15 0356 07/25/15 0607 07/26/15 0528  WBC 21.4* 15.5* 12.8*  HGB 13.2 10.8* 11.0*  PLT 223 197 204  CREATININE 1.01* 0.87 0.88   Estimated Creatinine Clearance: 48.8 mL/min (by C-G formula based on Cr of 0.88). No results for input(s): VANCOTROUGH, VANCOPEAK, VANCORANDOM, GENTTROUGH, GENTPEAK, GENTRANDOM, TOBRATROUGH, TOBRAPEAK, TOBRARND, AMIKACINPEAK, AMIKACINTROU, AMIKACIN in the last 72 hours.   Microbiology: Recent Results (from the past 720 hour(s))  Culture, blood (routine x 2)     Status: None (Preliminary result)   Collection Time: 07/23/15  5:10 PM  Result Value Ref Range Status   Specimen Description BLOOD LEFT ARM  Final   Special Requests BOTTLES DRAWN AEROBIC AND ANAEROBIC  Final   Culture NO GROWTH 2 DAYS  Final   Report Status PENDING  Incomplete  Culture, blood (routine x 2)     Status: None (Preliminary result)   Collection Time: 07/23/15  5:35 PM  Result Value Ref Range Status   Specimen Description BLOOD RIGHT ARM  Final   Special Requests BOTTLES DRAWN AEROBIC AND ANAEROBIC 5CC  Final   Culture NO GROWTH 2 DAYS  Final   Report Status PENDING  Incomplete  Urine culture     Status: None   Collection Time: 07/23/15  7:40 PM  Result Value Ref Range Status   Specimen Description URINE, RANDOM  Final   Special Requests NONE   Final   Culture NO GROWTH 2 DAYS  Final   Report Status 07/25/2015 FINAL  Final  MRSA PCR Screening     Status: None   Collection Time: 07/24/15  6:02 AM  Result Value Ref Range Status   MRSA by PCR NEGATIVE NEGATIVE Final    Comment:        The GeneXpert MRSA Assay (FDA approved for NASAL specimens only), is one component of a comprehensive MRSA colonization surveillance program. It is not intended to diagnose MRSA infection nor to guide or monitor treatment for MRSA infections.     Medical History: Past Medical History  Diagnosis Date  . Schizoaffective disorder   . Personality disorder   . Osteopenia   . Bipolar disorder   . Weakness   . COPD (chronic obstructive pulmonary disease)    Assessment: 64 yo female on day #4 IV abx for rule out sepsis.  Pt is clinically improving and cx remain negative.  MD has discontinued Vancomycin.  Goal of Therapy:    Plan:  Continue Zosyn 3.375 g every 8 hours  No further dose adjustments anticipated Rx will sign off  [employer], Pharm.D., BCPS Clinical Pharmacist Pager [phone] 07/26/2015 9:54 AM

## 2015-07-26 NOTE — Care Management Important Message (Signed)
Important Message  Patient Details  Name: Mary Avila MRN: [id number] Date of Birth: October 02, 1951   Medicare Important Message Given:  Yes-second notification given    Lawerance Sabal, RN 07/26/2015, 1:15 PMImportant Message  Patient Details  Name: Mary Avila MRN: [id number] Date of Birth: [birth year]   Medicare Important Message Given:  Yes-second notification given    Lawerance Sabal, RN 07/26/2015, 1:14 PM

## 2015-07-27 DIAGNOSIS — N251 Nephrogenic diabetes insipidus: Secondary | ICD-10-CM

## 2015-07-27 LAB — BASIC METABOLIC PANEL
ANION GAP: 6 (ref 5–15)
BUN: 7 mg/dL (ref 6–20)
CO2: 22 mmol/L (ref 22–32)
Calcium: 8.6 mg/dL — ABNORMAL LOW (ref 8.9–10.3)
Chloride: 118 mmol/L — ABNORMAL HIGH (ref 101–111)
Creatinine, Ser: 0.78 mg/dL (ref 0.44–1.00)
GFR calc Af Amer: >60 mL/min (ref >60)
GFR calc non Af Amer: >60 mL/min (ref >60)
GLUCOSE: 110 mg/dL — AB (ref 65–99)
POTASSIUM: 3.9 mmol/L (ref 3.5–5.1)
Sodium: 146 mmol/L — ABNORMAL HIGH (ref 135–145)

## 2015-07-27 NOTE — Progress Notes (Signed)
Patient Demographics  Mary Avila, is a 64 y.o. female, DOB - December 17, 1950, ZOX:[id number]  Admit date - 07/23/2015   Admitting Physician Eduard Clos, MD  Outpatient Primary MD for the patient is No primary care provider on file.  LOS - 4   Chief Complaint  Patient presents with  . Fever         Subjective:   Mary Avila today has, No headache, No chest pain, No abdominal pain - No Nausea, No new weakness tingling or numbness, cough and shortness of breath are improving, patient remains with significant urinary output yesterday, and reports she is constantly thirsty.  Assessment & Plan    Principal Problem:   Sepsis Active Problems:   Pneumonia   COPD (chronic obstructive pulmonary disease)   Bipolar disorder  Sepsis secondary to pneumonia - Treated with IV vancomycin and Zosyn for healthcare associated pneumonia, given negative blood cultures,will stop IV antibiotics and transitioned to oral Augmentin -  Legionella , HIV and strep P antigen are negative -  blood cultures NGTD -  on pulmonary toilette, flutter valve and Mucinex  COPD - No active wheezing, continue with nebs, continue with Advair  Acute renal failure - due to volume depletion and infection,resolved  Hypernatremia  - In the setting of nephrogenic diabetes insipidus.  Nephrogenic Diabetes insipidus  - Patient with significant urine output(8 L yesterday), has low urine osmolality, and elevated sodium osmolality, picture compatible with nephrogenic diabetes insipidus. - Nephrology consult appreciated - Lithium has been stopped as discussed with psychiatric - Patient with significant urinary output, will discontinue her IV fluids today to see if she is able to maintain normal sodium level, and keep hydrated on her own without fluids as discussed with nephrology, check BMP in a.m.Marland Kitchen  Bipolar disorder - Will  stop patient lithium giving her nephrogenic diabetes insipidus, started her on Depakote 500 mg twice a day instead as discussed with inpatient psychiatry Dr. Shela Commons, will continue with olanzapine .  Elevated blood glucose - No history of diabetes mellitus, glucose level are acceptable during hospital stay   Code Status: Full  Family Communication: None at bedside  Disposition Plan: Home once stable   Procedures  None   Consults   None   Medications  Scheduled Meds: . antiseptic oral rinse  7 mL Mouth Rinse BID  . enoxaparin (LOVENOX) injection  40 mg Subcutaneous Q24H  . feeding supplement (ENSURE ENLIVE)  237 mL Oral BID BM  . guaiFENesin  600 mg Oral BID  . ipratropium-albuterol  3 mL Nebulization Q6H  . mometasone-formoterol  2 puff Inhalation BID  . OLANZapine  15 mg Oral QHS  . OLANZapine  5 mg Oral Daily  . piperacillin-tazobactam (ZOSYN)  IV  3.375 g Intravenous Q8H  . topiramate  25 mg Oral Daily  . topiramate  50 mg Oral QHS  . valproic acid  500 mg Oral BID   Continuous Infusions:   PRN Meds:.acetaminophen **OR** acetaminophen, albuterol, ondansetron **OR** ondansetron (ZOFRAN) IV  DVT Prophylaxis  Lovenox -  Lab Results  Component Value Date   PLT 204 07/26/2015    Antibiotics    Anti-infectives    Start     Dose/Rate Route Frequency Ordered Stop   07/24/15 1800  vancomycin (VANCOCIN) IVPB 750 mg/150 ml premix  Status:  Discontinued     750 mg 150 mL/hr over 60 Minutes Intravenous Every 24 hours 07/23/15 1914 07/26/15 0740   07/24/15 0100  piperacillin-tazobactam (ZOSYN) IVPB 3.375 g     3.375 g 12.5 mL/hr over 240 Minutes Intravenous Every 8 hours 07/23/15 1914     07/23/15 2215  vancomycin (VANCOCIN) IVPB 1000 mg/200 mL premix  Status:  Discontinued     1,000 mg 200 mL/hr over 60 Minutes Intravenous  Once 07/23/15 2208 07/23/15 2210   07/23/15 2215  piperacillin-tazobactam (ZOSYN) IVPB 3.375 g  Status:  Discontinued     3.375 g 100 mL/hr over 30  Minutes Intravenous 3 times per day 07/23/15 2208 07/23/15 2210   07/23/15 1745  piperacillin-tazobactam (ZOSYN) IVPB 3.375 g     3.375 g 100 mL/hr over 30 Minutes Intravenous  Once 07/23/15 1732 07/23/15 1807   07/23/15 1730  vancomycin (VANCOCIN) IVPB 1000 mg/200 mL premix     1,000 mg 200 mL/hr over 60 Minutes Intravenous  Once 07/23/15 1727 07/23/15 1937   07/23/15 1730  piperacillin-tazobactam (ZOSYN) IVPB 3.375 g  Status:  Discontinued     3.375 g 100 mL/hr over 30 Minutes Intravenous 3 times per day 07/23/15 1727 07/23/15 1732          Objective:   Filed Vitals:   07/26/15 1453 07/26/15 2107 07/27/15 0531 07/27/15 0939  BP: 118/94 121/77 119/79   Pulse: 92 87 78   Temp: 98.7 F (37.1 C) 98.5 F (36.9 C) 98.4 F (36.9 C)   TempSrc: Oral Oral Oral   Resp: 18 18 18    Height:      Weight:      SpO2: 97% 94% 97% 93%    Wt Readings from Last 3 Encounters:  07/23/15 47.174 kg (104 lb)  01/11/12 50.349 kg (111 lb)     Intake/Output Summary (Last 24 hours) at 07/27/15 1305 Last data filed at 07/27/15 0932  Gross per 24 hour  Intake 4217.91 ml  Output   7600 ml  Net -3382.09 ml     Physical Exam  Awake Alert, Oriented X 3, No new F.N deficits, Normal affect .AT,PERRAL Supple Neck,No JVD, No cervical lymphadenopathy appriciated.  Symmetrical Chest wall movement, Good air movement bilaterally, no wheezing RRR,No Gallops,Rubs or new Murmurs, No Parasternal Heave +ve B.Sounds, Abd Soft, No tenderness, No organomegaly appriciated, No rebound - guarding or rigidity. No Cyanosis, Clubbing or edema, No new Rash or bruise     Data Review   Micro Results Recent Results (from the past 240 hour(s))  Culture, blood (routine x 2)     Status: None (Preliminary result)   Collection Time: 07/23/15  5:10 PM  Result Value Ref Range Status   Specimen Description BLOOD LEFT ARM  Final   Special Requests BOTTLES DRAWN AEROBIC AND ANAEROBIC  Final   Culture NO GROWTH 3  DAYS  Final   Report Status PENDING  Incomplete  Culture, blood (routine x 2)     Status: None (Preliminary result)   Collection Time: 07/23/15  5:35 PM  Result Value Ref Range Status   Specimen Description BLOOD RIGHT ARM  Final   Special Requests BOTTLES DRAWN AEROBIC AND ANAEROBIC 5CC  Final   Culture NO GROWTH 3 DAYS  Final   Report Status PENDING  Incomplete  Urine culture     Status: None   Collection Time: 07/23/15  7:40 PM  Result Value Ref Range Status  Specimen Description URINE, RANDOM  Final   Special Requests NONE  Final   Culture NO GROWTH 2 DAYS  Final   Report Status 07/25/2015 FINAL  Final  MRSA PCR Screening     Status: None   Collection Time: 07/24/15  6:02 AM  Result Value Ref Range Status   MRSA by PCR NEGATIVE NEGATIVE Final    Comment:        The GeneXpert MRSA Assay (FDA approved for NASAL specimens only), is one component of a comprehensive MRSA colonization surveillance program. It is not intended to diagnose MRSA infection nor to guide or monitor treatment for MRSA infections.     Radiology Reports Dg Chest Port 1 View  07/23/2015   CLINICAL DATA:  Cough.  Shortness of breath.  Fever.  EXAM: PORTABLE CHEST - 1 VIEW  COMPARISON:  01/22/2007  FINDINGS: Tapering of the peripheral pulmonary vasculature favors emphysema. Atherosclerotic calcification of the aortic arch is present.  Heart size within normal limits. Mild symmetric interstitial accentuation of both lung bases.  IMPRESSION: 1. Emphysema. 2. Faint interstitial accentuation of both lung bases is technically nonspecific but could be a manifestation of drug reaction, subtle edema, or atypical infectious process.   Electronically Signed   By: Gaylyn Rong M.D.   On: 07/23/2015 17:57     CBC  Recent Labs Lab 07/23/15 1710 07/23/15 2230 07/24/15 0356 07/25/15 0607 07/26/15 0528  WBC 22.5* 14.3* 21.4* 15.5* 12.8*  HGB 13.7 12.7 13.2 10.8* 11.0*  HCT 42.3 39.8 40.9 34.4* 35.4*  PLT  232 221 223 197 204  MCV 104.2* 103.6* 104.9* 105.5* 106.3*  MCH 33.7 33.1 33.8 33.1 33.0  MCHC 32.4 31.9 32.3 31.4 31.1  RDW 13.5 13.6 13.7 14.0 14.2  LYMPHSABS 1.1  --  0.9  --   --   MONOABS 2.5*  --  2.4*  --   --   EOSABS 0.0  --  0.0  --   --   BASOSABS 0.0  --  0.0  --   --     Chemistries   Recent Labs Lab 07/23/15 1710 07/23/15 2230 07/24/15 0356 07/25/15 0607 07/26/15 0528 07/27/15 0511  NA 135  --  141 147* 145 146*  K 4.2  --  3.9 3.9 3.8 3.9  CL 106  --  113* 122* 117* 118*  CO2 17*  --  22 22 22 22   GLUCOSE 202*  --  153* 112* 119* 110*  BUN 9  --  8 9 <5* 7  CREATININE 1.11* 1.04* 1.01* 0.87 0.88 0.78  CALCIUM 9.3  --  9.0 8.6* 8.6* 8.6*  AST 25  --  24  --   --   --   ALT 13*  --  15  --   --   --   ALKPHOS 79  --  57  --   --   --   BILITOT 0.5  --  0.5  --   --   --    ------------------------------------------------------------------------------------------------------------------ estimated creatinine clearance is 53.6 mL/min (by C-G formula based on Cr of 0.78). ------------------------------------------------------------------------------------------------------------------ No results for input(s): HGBA1C in the last 72 hours. ------------------------------------------------------------------------------------------------------------------ No results for input(s): CHOL, HDL, LDLCALC, TRIG, CHOLHDL, LDLDIRECT in the last 72 hours. ------------------------------------------------------------------------------------------------------------------ No results for input(s): TSH, T4TOTAL, T3FREE, THYROIDAB in the last 72 hours.  Invalid input(s): FREET3 ------------------------------------------------------------------------------------------------------------------ No results for input(s): VITAMINB12, FOLATE, FERRITIN, TIBC, IRON, RETICCTPCT in the last 72 hours.  Coagulation profile No results for input(s): INR,  PROTIME in the last 168 hours.  No  results for input(s): DDIMER in the last 72 hours.  Cardiac Enzymes  Recent Labs Lab 07/23/15 2230 07/24/15 0356 07/24/15 1010  TROPONINI <0.03 <0.03 <0.03   ------------------------------------------------------------------------------------------------------------------ Invalid input(s): POCBNP     Time Spent in minutes   30 minutes   Shaleka Brines M.D on 07/27/2015 at 1:05 PM  Between 7am to 7pm - Pager - [phone]  After 7pm go to [email or web address] - password [email or web address]  [facility]   Office  [phone]                                                                                         Patient Demographics  Mary Avila, is a 64 y.o. female, DOB - 1951/05/08, GNF:[id number]  Admit date - 07/23/2015   Admitting Physician Eduard Clos, MD  Outpatient Primary MD for the patient is No primary care provider on file.  LOS - 4   Chief Complaint  Patient presents with  . Fever         Subjective:   Mary Avila today has, No headache, No chest pain, No abdominal pain - No Nausea, No new weakness tingling or numbness, cough and shortness of breath are improving, a shunt has significant urinary output yesterday, and reports she is constantly thirsty.  Assessment & Plan    Principal Problem:   Sepsis Active Problems:   Pneumonia   COPD (chronic obstructive pulmonary disease)   Bipolar disorder  Sepsis secondary to pneumonia - Treated with IV vancomycin and Zosyn for healthcare associated pneumonia, given negative blood cultures, will stop IV vancomycin, continue with Zosyn -  Legionella , HIV and strep P antigen are negative -  blood cultures NGTD -  on pulmonary toilette, flutter valve and Mucinex  COPD - No active wheezing, continue with nebs, continue with Advair  Acute renal failure - due to volume depletion and infection,resolved  Hypernatremia  - Resolved with D5 half-normal saline .  Nephrogenic Diabetes insipidus  -  Patient with significant urine output(10 L yesterday), has low urine osmolality, and elevated sodium osmolality, picture compatible with nephrogenic diabetes insipidus. - Nephrology consult appreciated - We'll discontinue lithium - Patient has free access to water and liquids  Bipolar disorder - Will start patient lithium giving her nephrogenic diabetes insipidus, will start her on Depakote 500 mg twice a day instead as discussed with inpatient psychiatrically Dr. Shela Commons, will continue with olanzapine .  Elevated blood glucose - No history of diabetes mellitus, check hemoglobin A1c, monitor CBG closely.   Code Status: Full  Family Communication: None at bedside  Disposition Plan: Home once stable   Procedures  None   Consults   None   Medications  Scheduled Meds: . antiseptic oral rinse  7 mL Mouth Rinse BID  . enoxaparin (LOVENOX) injection  40 mg Subcutaneous Q24H  . feeding supplement (ENSURE ENLIVE)  237 mL Oral BID BM  . guaiFENesin  600 mg Oral BID  . ipratropium-albuterol  3 mL Nebulization Q6H  . mometasone-formoterol  2 puff Inhalation BID  . OLANZapine  15 mg Oral QHS  . OLANZapine  5 mg  Oral Daily  . piperacillin-tazobactam (ZOSYN)  IV  3.375 g Intravenous Q8H  . topiramate  25 mg Oral Daily  . topiramate  50 mg Oral QHS  . valproic acid  500 mg Oral BID   Continuous Infusions:   PRN Meds:.acetaminophen **OR** acetaminophen, albuterol, ondansetron **OR** ondansetron (ZOFRAN) IV  DVT Prophylaxis  Lovenox -  Lab Results  Component Value Date   PLT 204 07/26/2015    Antibiotics    Anti-infectives    Start     Dose/Rate Route Frequency Ordered Stop   07/24/15 1800  vancomycin (VANCOCIN) IVPB 750 mg/150 ml premix  Status:  Discontinued     750 mg 150 mL/hr over 60 Minutes Intravenous Every 24 hours 07/23/15 1914 07/26/15 0740   07/24/15 0100  piperacillin-tazobactam (ZOSYN) IVPB 3.375 g     3.375 g 12.5 mL/hr over 240 Minutes Intravenous Every 8 hours  07/23/15 1914     07/23/15 2215  vancomycin (VANCOCIN) IVPB 1000 mg/200 mL premix  Status:  Discontinued     1,000 mg 200 mL/hr over 60 Minutes Intravenous  Once 07/23/15 2208 07/23/15 2210   07/23/15 2215  piperacillin-tazobactam (ZOSYN) IVPB 3.375 g  Status:  Discontinued     3.375 g 100 mL/hr over 30 Minutes Intravenous 3 times per day 07/23/15 2208 07/23/15 2210   07/23/15 1745  piperacillin-tazobactam (ZOSYN) IVPB 3.375 g     3.375 g 100 mL/hr over 30 Minutes Intravenous  Once 07/23/15 1732 07/23/15 1807   07/23/15 1730  vancomycin (VANCOCIN) IVPB 1000 mg/200 mL premix     1,000 mg 200 mL/hr over 60 Minutes Intravenous  Once 07/23/15 1727 07/23/15 1937   07/23/15 1730  piperacillin-tazobactam (ZOSYN) IVPB 3.375 g  Status:  Discontinued     3.375 g 100 mL/hr over 30 Minutes Intravenous 3 times per day 07/23/15 1727 07/23/15 1732          Objective:   Filed Vitals:   07/26/15 1453 07/26/15 2107 07/27/15 0531 07/27/15 0939  BP: 118/94 121/77 119/79   Pulse: 92 87 78   Temp: 98.7 F (37.1 C) 98.5 F (36.9 C) 98.4 F (36.9 C)   TempSrc: Oral Oral Oral   Resp: 18 18 18    Height:      Weight:      SpO2: 97% 94% 97% 93%    Wt Readings from Last 3 Encounters:  07/23/15 47.174 kg (104 lb)  01/11/12 50.349 kg (111 lb)     Intake/Output Summary (Last 24 hours) at 07/27/15 1306 Last data filed at 07/27/15 0932  Gross per 24 hour  Intake 4217.91 ml  Output   7600 ml  Net -3382.09 ml     Physical Exam  Awake Alert, Oriented X 3, No new F.N deficits, Normal affect [place].AT,PERRAL Supple Neck,No JVD, No cervical lymphadenopathy appriciated.  Symmetrical Chest wall movement, Good air movement bilaterally, no wheezing RRR,No Gallops,Rubs or new Murmurs, No Parasternal Heave +ve B.Sounds, Abd Soft, No tenderness, No organomegaly appriciated, No rebound - guarding or rigidity. No Cyanosis, Clubbing or edema, No new Rash or bruise     Data Review   Micro Results Recent  Results (from the past 240 hour(s))  Culture, blood (routine x 2)     Status: None (Preliminary result)   Collection Time: 07/23/15  5:10 PM  Result Value Ref Range Status   Specimen Description BLOOD LEFT ARM  Final   Special Requests BOTTLES DRAWN AEROBIC AND ANAEROBIC  Final   Culture NO GROWTH 3  DAYS  Final   Report Status PENDING  Incomplete  Culture, blood (routine x 2)     Status: None (Preliminary result)   Collection Time: 07/23/15  5:35 PM  Result Value Ref Range Status   Specimen Description BLOOD RIGHT ARM  Final   Special Requests BOTTLES DRAWN AEROBIC AND ANAEROBIC 5CC  Final   Culture NO GROWTH 3 DAYS  Final   Report Status PENDING  Incomplete  Urine culture     Status: None   Collection Time: 07/23/15  7:40 PM  Result Value Ref Range Status   Specimen Description URINE, RANDOM  Final   Special Requests NONE  Final   Culture NO GROWTH 2 DAYS  Final   Report Status 07/25/2015 FINAL  Final  MRSA PCR Screening     Status: None   Collection Time: 07/24/15  6:02 AM  Result Value Ref Range Status   MRSA by PCR NEGATIVE NEGATIVE Final    Comment:        The GeneXpert MRSA Assay (FDA approved for NASAL specimens only), is one component of a comprehensive MRSA colonization surveillance program. It is not intended to diagnose MRSA infection nor to guide or monitor treatment for MRSA infections.     Radiology Reports Dg Chest Port 1 View  07/23/2015   CLINICAL DATA:  Cough.  Shortness of breath.  Fever.  EXAM: PORTABLE CHEST - 1 VIEW  COMPARISON:  01/22/2007  FINDINGS: Tapering of the peripheral pulmonary vasculature favors emphysema. Atherosclerotic calcification of the aortic arch is present.  Heart size within normal limits. Mild symmetric interstitial accentuation of both lung bases.  IMPRESSION: 1. Emphysema. 2. Faint interstitial accentuation of both lung bases is technically nonspecific but could be a manifestation of drug reaction, subtle edema, or atypical  infectious process.   Electronically Signed   By: Gaylyn Rong M.D.   On: 07/23/2015 17:57     CBC  Recent Labs Lab 07/23/15 1710 07/23/15 2230 07/24/15 0356 07/25/15 0607 07/26/15 0528  WBC 22.5* 14.3* 21.4* 15.5* 12.8*  HGB 13.7 12.7 13.2 10.8* 11.0*  HCT 42.3 39.8 40.9 34.4* 35.4*  PLT 232 221 223 197 204  MCV 104.2* 103.6* 104.9* 105.5* 106.3*  MCH 33.7 33.1 33.8 33.1 33.0  MCHC 32.4 31.9 32.3 31.4 31.1  RDW 13.5 13.6 13.7 14.0 14.2  LYMPHSABS 1.1  --  0.9  --   --   MONOABS 2.5*  --  2.4*  --   --   EOSABS 0.0  --  0.0  --   --   BASOSABS 0.0  --  0.0  --   --     Chemistries   Recent Labs Lab 07/23/15 1710 07/23/15 2230 07/24/15 0356 07/25/15 0607 07/26/15 0528 07/27/15 0511  NA 135  --  141 147* 145 146*  K 4.2  --  3.9 3.9 3.8 3.9  CL 106  --  113* 122* 117* 118*  CO2 17*  --  22 22 22 22   GLUCOSE 202*  --  153* 112* 119* 110*  BUN 9  --  8 9 <5* 7  CREATININE 1.11* 1.04* 1.01* 0.87 0.88 0.78  CALCIUM 9.3  --  9.0 8.6* 8.6* 8.6*  AST 25  --  24  --   --   --   ALT 13*  --  15  --   --   --   ALKPHOS 79  --  57  --   --   --   BILITOT  0.5  --  0.5  --   --   --    ------------------------------------------------------------------------------------------------------------------ estimated creatinine clearance is 53.6 mL/min (by C-G formula based on Cr of 0.78). ------------------------------------------------------------------------------------------------------------------ No results for input(s): HGBA1C in the last 72 hours. ------------------------------------------------------------------------------------------------------------------ No results for input(s): CHOL, HDL, LDLCALC, TRIG, CHOLHDL, LDLDIRECT in the last 72 hours. ------------------------------------------------------------------------------------------------------------------ No results for input(s): TSH, T4TOTAL, T3FREE, THYROIDAB in the last 72 hours.  Invalid input(s):  FREET3 ------------------------------------------------------------------------------------------------------------------ No results for input(s): VITAMINB12, FOLATE, FERRITIN, TIBC, IRON, RETICCTPCT in the last 72 hours.  Coagulation profile No results for input(s): INR, PROTIME in the last 168 hours.  No results for input(s): DDIMER in the last 72 hours.  Cardiac Enzymes  Recent Labs Lab 07/23/15 2230 07/24/15 0356 07/24/15 1010  TROPONINI <0.03 <0.03 <0.03   ------------------------------------------------------------------------------------------------------------------ Invalid input(s): POCBNP     Time Spent in minutes   30 minutes   Johann Gascoigne M.D on 07/27/2015 at 1:06 PM  Between 7am to 7pm - Pager - [phone]  After 7pm go to [email or web address] - password [facility]  [facility]   Office  [phone]                                                                                         Patient Demographics  Mary Avila, is a 64 y.o. female, DOB - 01/29/1951, [id number]  Admit date - 07/23/2015   Admitting Physician Eduard Clos, MD  Outpatient Primary MD for the patient is No primary care provider on file.  LOS - 4   Chief Complaint  Patient presents with  . Fever         Subjective:   Mary Avila today has, No headache, No chest pain, No abdominal pain - No Nausea, No new weakness tingling or numbness, cough and shortness of breath are improving, a shunt has significant urinary output yesterday, and reports she is constantly thirsty.  Assessment & Plan    Principal Problem:   Sepsis Active Problems:   Pneumonia   COPD (chronic obstructive pulmonary disease)   Bipolar disorder  Sepsis secondary to pneumonia - Treated with IV vancomycin and Zosyn for healthcare associated pneumonia, given negative blood cultures, will stop IV vancomycin, continue with Zosyn -  Legionella , HIV and strep P antigen are negative -   blood cultures NGTD -  on pulmonary toilette, flutter valve and Mucinex  COPD - No active wheezing, continue with nebs, continue with Advair  Acute renal failure - due to volume depletion and infection,resolved  Hypernatremia  - Resolved with D5 half-normal saline .  Nephrogenic Diabetes insipidus  - Patient with significant urine output(10 L yesterday), has low urine osmolality, and elevated sodium osmolality, picture compatible with nephrogenic diabetes insipidus. - Nephrology consult appreciated - We'll discontinue lithium - Patient has free access to water and liquids  Bipolar disorder - Will start patient lithium giving her nephrogenic diabetes insipidus, will start her on Depakote 500 mg twice a day instead as discussed with inpatient psychiatrically Dr. Shela Commons, will continue with olanzapine .  Elevated blood glucose - No history of diabetes mellitus, check hemoglobin A1c, monitor CBG closely.   Code Status:  Full  Family Communication: None at bedside  Disposition Plan: Home once stable   Procedures  None   Consults   None   Medications  Scheduled Meds: . antiseptic oral rinse  7 mL Mouth Rinse BID  . enoxaparin (LOVENOX) injection  40 mg Subcutaneous Q24H  . feeding supplement (ENSURE ENLIVE)  237 mL Oral BID BM  . guaiFENesin  600 mg Oral BID  . ipratropium-albuterol  3 mL Nebulization Q6H  . mometasone-formoterol  2 puff Inhalation BID  . OLANZapine  15 mg Oral QHS  . OLANZapine  5 mg Oral Daily  . piperacillin-tazobactam (ZOSYN)  IV  3.375 g Intravenous Q8H  . topiramate  25 mg Oral Daily  . topiramate  50 mg Oral QHS  . valproic acid  500 mg Oral BID   Continuous Infusions:   PRN Meds:.acetaminophen **OR** acetaminophen, albuterol, ondansetron **OR** ondansetron (ZOFRAN) IV  DVT Prophylaxis  Lovenox -  Lab Results  Component Value Date   PLT 204 07/26/2015    Antibiotics    Anti-infectives    Start     Dose/Rate Route Frequency Ordered Stop    07/24/15 1800  vancomycin (VANCOCIN) IVPB 750 mg/150 ml premix  Status:  Discontinued     750 mg 150 mL/hr over 60 Minutes Intravenous Every 24 hours 07/23/15 1914 07/26/15 0740   07/24/15 0100  piperacillin-tazobactam (ZOSYN) IVPB 3.375 g     3.375 g 12.5 mL/hr over 240 Minutes Intravenous Every 8 hours 07/23/15 1914     07/23/15 2215  vancomycin (VANCOCIN) IVPB 1000 mg/200 mL premix  Status:  Discontinued     1,000 mg 200 mL/hr over 60 Minutes Intravenous  Once 07/23/15 2208 07/23/15 2210   07/23/15 2215  piperacillin-tazobactam (ZOSYN) IVPB 3.375 g  Status:  Discontinued     3.375 g 100 mL/hr over 30 Minutes Intravenous 3 times per day 07/23/15 2208 07/23/15 2210   07/23/15 1745  piperacillin-tazobactam (ZOSYN) IVPB 3.375 g     3.375 g 100 mL/hr over 30 Minutes Intravenous  Once 07/23/15 1732 07/23/15 1807   07/23/15 1730  vancomycin (VANCOCIN) IVPB 1000 mg/200 mL premix     1,000 mg 200 mL/hr over 60 Minutes Intravenous  Once 07/23/15 1727 07/23/15 1937   07/23/15 1730  piperacillin-tazobactam (ZOSYN) IVPB 3.375 g  Status:  Discontinued     3.375 g 100 mL/hr over 30 Minutes Intravenous 3 times per day 07/23/15 1727 07/23/15 1732          Objective:   Filed Vitals:   07/26/15 1453 07/26/15 2107 07/27/15 0531 07/27/15 0939  BP: 118/94 121/77 119/79   Pulse: 92 87 78   Temp: 98.7 F (37.1 C) 98.5 F (36.9 C) 98.4 F (36.9 C)   TempSrc: Oral Oral Oral   Resp: 18 18 18    Height:      Weight:      SpO2: 97% 94% 97% 93%    Wt Readings from Last 3 Encounters:  07/23/15 47.174 kg (104 lb)  01/11/12 50.349 kg (111 lb)     Intake/Output Summary (Last 24 hours) at 07/27/15 1306 Last data filed at 07/27/15 0932  Gross per 24 hour  Intake 4217.91 ml  Output   7600 ml  Net -3382.09 ml     Physical Exam  Awake Alert, Oriented X 3, No new F.N deficits, Normal affect [place].AT,PERRAL Supple Neck,No JVD, No cervical lymphadenopathy appriciated.  Symmetrical Chest wall  movement, Good air movement bilaterally, no wheezing RRR,No Gallops,Rubs or new Murmurs,  No Parasternal Heave +ve B.Sounds, Abd Soft, No tenderness, No organomegaly appriciated, No rebound - guarding or rigidity. No Cyanosis, Clubbing or edema, No new Rash or bruise     Data Review   Micro Results Recent Results (from the past 240 hour(s))  Culture, blood (routine x 2)     Status: None (Preliminary result)   Collection Time: 07/23/15  5:10 PM  Result Value Ref Range Status   Specimen Description BLOOD LEFT ARM  Final   Special Requests BOTTLES DRAWN AEROBIC AND ANAEROBIC  Final   Culture NO GROWTH 3 DAYS  Final   Report Status PENDING  Incomplete  Culture, blood (routine x 2)     Status: None (Preliminary result)   Collection Time: 07/23/15  5:35 PM  Result Value Ref Range Status   Specimen Description BLOOD RIGHT ARM  Final   Special Requests BOTTLES DRAWN AEROBIC AND ANAEROBIC 5CC  Final   Culture NO GROWTH 3 DAYS  Final   Report Status PENDING  Incomplete  Urine culture     Status: None   Collection Time: 07/23/15  7:40 PM  Result Value Ref Range Status   Specimen Description URINE, RANDOM  Final   Special Requests NONE  Final   Culture NO GROWTH 2 DAYS  Final   Report Status 07/25/2015 FINAL  Final  MRSA PCR Screening     Status: None   Collection Time: 07/24/15  6:02 AM  Result Value Ref Range Status   MRSA by PCR NEGATIVE NEGATIVE Final    Comment:        The GeneXpert MRSA Assay (FDA approved for NASAL specimens only), is one component of a comprehensive MRSA colonization surveillance program. It is not intended to diagnose MRSA infection nor to guide or monitor treatment for MRSA infections.     Radiology Reports Dg Chest Port 1 View  07/23/2015   CLINICAL DATA:  Cough.  Shortness of breath.  Fever.  EXAM: PORTABLE CHEST - 1 VIEW  COMPARISON:  01/22/2007  FINDINGS: Tapering of the peripheral pulmonary vasculature favors emphysema. Atherosclerotic  calcification of the aortic arch is present.  Heart size within normal limits. Mild symmetric interstitial accentuation of both lung bases.  IMPRESSION: 1. Emphysema. 2. Faint interstitial accentuation of both lung bases is technically nonspecific but could be a manifestation of drug reaction, subtle edema, or atypical infectious process.   Electronically Signed   By: Gaylyn Rong M.D.   On: 07/23/2015 17:57     CBC  Recent Labs Lab 07/23/15 1710 07/23/15 2230 07/24/15 0356 07/25/15 0607 07/26/15 0528  WBC 22.5* 14.3* 21.4* 15.5* 12.8*  HGB 13.7 12.7 13.2 10.8* 11.0*  HCT 42.3 39.8 40.9 34.4* 35.4*  PLT 232 221 223 197 204  MCV 104.2* 103.6* 104.9* 105.5* 106.3*  MCH 33.7 33.1 33.8 33.1 33.0  MCHC 32.4 31.9 32.3 31.4 31.1  RDW 13.5 13.6 13.7 14.0 14.2  LYMPHSABS 1.1  --  0.9  --   --   MONOABS 2.5*  --  2.4*  --   --   EOSABS 0.0  --  0.0  --   --   BASOSABS 0.0  --  0.0  --   --     Chemistries   Recent Labs Lab 07/23/15 1710 07/23/15 2230 07/24/15 0356 07/25/15 0607 07/26/15 0528 07/27/15 0511  NA 135  --  141 147* 145 146*  K 4.2  --  3.9 3.9 3.8 3.9  CL 106  --  113* 122*  117* 118*  CO2 17*  --  22 22 22 22   GLUCOSE 202*  --  153* 112* 119* 110*  BUN 9  --  8 9 <5* 7  CREATININE 1.11* 1.04* 1.01* 0.87 0.88 0.78  CALCIUM 9.3  --  9.0 8.6* 8.6* 8.6*  AST 25  --  24  --   --   --   ALT 13*  --  15  --   --   --   ALKPHOS 79  --  57  --   --   --   BILITOT 0.5  --  0.5  --   --   --    ------------------------------------------------------------------------------------------------------------------ estimated creatinine clearance is 53.6 mL/min (by C-G formula based on Cr of 0.78). ------------------------------------------------------------------------------------------------------------------ No results for input(s): HGBA1C in the last 72  hours. ------------------------------------------------------------------------------------------------------------------ No results for input(s): CHOL, HDL, LDLCALC, TRIG, CHOLHDL, LDLDIRECT in the last 72 hours. ------------------------------------------------------------------------------------------------------------------ No results for input(s): TSH, T4TOTAL, T3FREE, THYROIDAB in the last 72 hours.  Invalid input(s): FREET3 ------------------------------------------------------------------------------------------------------------------ No results for input(s): VITAMINB12, FOLATE, FERRITIN, TIBC, IRON, RETICCTPCT in the last 72 hours.  Coagulation profile No results for input(s): INR, PROTIME in the last 168 hours.  No results for input(s): DDIMER in the last 72 hours.  Cardiac Enzymes  Recent Labs Lab 07/23/15 2230 07/24/15 0356 07/24/15 1010  TROPONINI <0.03 <0.03 <0.03   ------------------------------------------------------------------------------------------------------------------ Invalid input(s): POCBNP     Time Spent in minutes   30 minutes   Hien Cunliffe M.D on 07/27/2015 at 1:06 PM  Between 7am to 7pm - Pager - [phone]  After 7pm go to [email or web address] - password [facility]  [facility]   Office  [phone]                                                                                         Patient Demographics  Mary Avila, is a 64 y.o. female, DOB - [birth year], YQM:[id number]  Admit date - 07/23/2015   Admitting Physician Eduard Clos, MD  Outpatient Primary MD for the patient is No primary care provider on file.  LOS - 4   Chief Complaint  Patient presents with  . Fever         Subjective:   Mary Avila today has, No headache, No chest pain, No abdominal pain - No Nausea, No new weakness tingling or numbness, cough and shortness of breath are improving, a shunt has significant urinary output yesterday, and  reports she is constantly thirsty.  Assessment & Plan    Principal Problem:   Sepsis Active Problems:   Pneumonia   COPD (chronic obstructive pulmonary disease)   Bipolar disorder  Sepsis secondary to pneumonia - Treated with IV vancomycin and Zosyn for healthcare associated pneumonia, given negative blood cultures, will stop IV vancomycin, continue with Zosyn -  Legionella , HIV and strep P antigen are negative -  blood cultures NGTD -  on pulmonary toilette, flutter valve and Mucinex  COPD - No active wheezing, continue with nebs, continue with Advair  Acute renal failure - due to volume depletion and infection,resolved  Hypernatremia  - Resolved with D5 half-normal saline .  Nephrogenic Diabetes insipidus  - Patient with significant urine output(10 L yesterday), has low urine osmolality, and elevated sodium osmolality, picture compatible with nephrogenic diabetes insipidus. - Nephrology consult appreciated - We'll discontinue lithium - Patient has free access to water and liquids  Bipolar disorder - Will start patient lithium giving her nephrogenic diabetes insipidus, will start her on Depakote 500 mg twice a day instead as discussed with inpatient psychiatrically Dr. Shela Commons, will continue with olanzapine .  Elevated blood glucose - No history of diabetes mellitus, check hemoglobin A1c, monitor CBG closely.   Code Status: Full  Family Communication: None at bedside  Disposition Plan: Home once stable   Procedures  None   Consults   None   Medications  Scheduled Meds: . antiseptic oral rinse  7 mL Mouth Rinse BID  . enoxaparin (LOVENOX) injection  40 mg Subcutaneous Q24H  . feeding supplement (ENSURE ENLIVE)  237 mL Oral BID BM  . guaiFENesin  600 mg Oral BID  . ipratropium-albuterol  3 mL Nebulization Q6H  . mometasone-formoterol  2 puff Inhalation BID  . OLANZapine  15 mg Oral QHS  . OLANZapine  5 mg Oral Daily  . piperacillin-tazobactam (ZOSYN)  IV   3.375 g Intravenous Q8H  . topiramate  25 mg Oral Daily  . topiramate  50 mg Oral QHS  . valproic acid  500 mg Oral BID   Continuous Infusions:   PRN Meds:.acetaminophen **OR** acetaminophen, albuterol, ondansetron **OR** ondansetron (ZOFRAN) IV  DVT Prophylaxis  Lovenox -  Lab Results  Component Value Date   PLT 204 07/26/2015    Antibiotics    Anti-infectives    Start     Dose/Rate Route Frequency Ordered Stop   07/24/15 1800  vancomycin (VANCOCIN) IVPB 750 mg/150 ml premix  Status:  Discontinued     750 mg 150 mL/hr over 60 Minutes Intravenous Every 24 hours 07/23/15 1914 07/26/15 0740   07/24/15 0100  piperacillin-tazobactam (ZOSYN) IVPB 3.375 g     3.375 g 12.5 mL/hr over 240 Minutes Intravenous Every 8 hours 07/23/15 1914     07/23/15 2215  vancomycin (VANCOCIN) IVPB 1000 mg/200 mL premix  Status:  Discontinued     1,000 mg 200 mL/hr over 60 Minutes Intravenous  Once 07/23/15 2208 07/23/15 2210   07/23/15 2215  piperacillin-tazobactam (ZOSYN) IVPB 3.375 g  Status:  Discontinued     3.375 g 100 mL/hr over 30 Minutes Intravenous 3 times per day 07/23/15 2208 07/23/15 2210   07/23/15 1745  piperacillin-tazobactam (ZOSYN) IVPB 3.375 g     3.375 g 100 mL/hr over 30 Minutes Intravenous  Once 07/23/15 1732 07/23/15 1807   07/23/15 1730  vancomycin (VANCOCIN) IVPB 1000 mg/200 mL premix     1,000 mg 200 mL/hr over 60 Minutes Intravenous  Once 07/23/15 1727 07/23/15 1937   07/23/15 1730  piperacillin-tazobactam (ZOSYN) IVPB 3.375 g  Status:  Discontinued     3.375 g 100 mL/hr over 30 Minutes Intravenous 3 times per day 07/23/15 1727 07/23/15 1732          Objective:   Filed Vitals:   07/26/15 1453 07/26/15 2107 07/27/15 0531 07/27/15 0939  BP: 118/94 121/77 119/79   Pulse: 92 87 78   Temp: 98.7 F (37.1 C) 98.5 F (36.9 C) 98.4 F (36.9 C)   TempSrc: Oral Oral Oral   Resp: 18 18 18    Height:      Weight:      SpO2: 97% 94% 97% 93%  Wt Readings from Last 3  Encounters:  07/23/15 47.174 kg (104 lb)  01/11/12 50.349 kg (111 lb)     Intake/Output Summary (Last 24 hours) at 07/27/15 1306 Last data filed at 07/27/15 0932  Gross per 24 hour  Intake 4217.91 ml  Output   7600 ml  Net -3382.09 ml     Physical Exam  Awake Alert, Oriented X 3, No new F.N deficits, Normal affect [place].AT,PERRAL Supple Neck,No JVD, No cervical lymphadenopathy appriciated.  Symmetrical Chest wall movement, Good air movement bilaterally, no wheezing RRR,No Gallops,Rubs or new Murmurs, No Parasternal Heave +ve B.Sounds, Abd Soft, No tenderness, No organomegaly appriciated, No rebound - guarding or rigidity. No Cyanosis, Clubbing or edema, No new Rash or bruise     Data Review   Micro Results Recent Results (from the past 240 hour(s))  Culture, blood (routine x 2)     Status: None (Preliminary result)   Collection Time: 07/23/15  5:10 PM  Result Value Ref Range Status   Specimen Description BLOOD LEFT ARM  Final   Special Requests BOTTLES DRAWN AEROBIC AND ANAEROBIC  Final   Culture NO GROWTH 3 DAYS  Final   Report Status PENDING  Incomplete  Culture, blood (routine x 2)     Status: None (Preliminary result)   Collection Time: 07/23/15  5:35 PM  Result Value Ref Range Status   Specimen Description BLOOD RIGHT ARM  Final   Special Requests BOTTLES DRAWN AEROBIC AND ANAEROBIC 5CC  Final   Culture NO GROWTH 3 DAYS  Final   Report Status PENDING  Incomplete  Urine culture     Status: None   Collection Time: 07/23/15  7:40 PM  Result Value Ref Range Status   Specimen Description URINE, RANDOM  Final   Special Requests NONE  Final   Culture NO GROWTH 2 DAYS  Final   Report Status 07/25/2015 FINAL  Final  MRSA PCR Screening     Status: None   Collection Time: 07/24/15  6:02 AM  Result Value Ref Range Status   MRSA by PCR NEGATIVE NEGATIVE Final    Comment:        The GeneXpert MRSA Assay (FDA approved for NASAL specimens only), is one component of  a comprehensive MRSA colonization surveillance program. It is not intended to diagnose MRSA infection nor to guide or monitor treatment for MRSA infections.     Radiology Reports Dg Chest Port 1 View  07/23/2015   CLINICAL DATA:  Cough.  Shortness of breath.  Fever.  EXAM: PORTABLE CHEST - 1 VIEW  COMPARISON:  01/22/2007  FINDINGS: Tapering of the peripheral pulmonary vasculature favors emphysema. Atherosclerotic calcification of the aortic arch is present.  Heart size within normal limits. Mild symmetric interstitial accentuation of both lung bases.  IMPRESSION: 1. Emphysema. 2. Faint interstitial accentuation of both lung bases is technically nonspecific but could be a manifestation of drug reaction, subtle edema, or atypical infectious process.   Electronically Signed   By: Gaylyn Rong M.D.   On: 07/23/2015 17:57     CBC  Recent Labs Lab 07/23/15 1710 07/23/15 2230 07/24/15 0356 07/25/15 0607 07/26/15 0528  WBC 22.5* 14.3* 21.4* 15.5* 12.8*  HGB 13.7 12.7 13.2 10.8* 11.0*  HCT 42.3 39.8 40.9 34.4* 35.4*  PLT 232 221 223 197 204  MCV 104.2* 103.6* 104.9* 105.5* 106.3*  MCH 33.7 33.1 33.8 33.1 33.0  MCHC 32.4 31.9 32.3 31.4 31.1  RDW 13.5 13.6 13.7 14.0 14.2  LYMPHSABS 1.1  --  0.9  --   --   MONOABS 2.5*  --  2.4*  --   --   EOSABS 0.0  --  0.0  --   --   BASOSABS 0.0  --  0.0  --   --     Chemistries   Recent Labs Lab 07/23/15 1710 07/23/15 2230 07/24/15 0356 07/25/15 0607 07/26/15 0528 07/27/15 0511  NA 135  --  141 147* 145 146*  K 4.2  --  3.9 3.9 3.8 3.9  CL 106  --  113* 122* 117* 118*  CO2 17*  --  GLUCOSE 202*  --  153* 112* 119* 110*  BUN 9  --  8 9 <5* 7  CREATININE 1.11* 1.04* 1.01* 0.87 0.88 0.78  CALCIUM 9.3  --  9.0 8.6* 8.6* 8.6*  AST 25  --  24  --   --   --   ALT 13*  --  15  --   --   --   ALKPHOS 79  --  57  --   --   --   BILITOT 0.5  --  0.5  --   --   --     ------------------------------------------------------------------------------------------------------------------ estimated creatinine clearance is 53.6 mL/min (by C-G formula based on Cr of 0.78). ------------------------------------------------------------------------------------------------------------------ No results for input(s): HGBA1C in the last 72 hours. ------------------------------------------------------------------------------------------------------------------ No results for input(s): CHOL, HDL, LDLCALC, TRIG, CHOLHDL, LDLDIRECT in the last 72 hours. ------------------------------------------------------------------------------------------------------------------ No results for input(s): TSH, T4TOTAL, T3FREE, THYROIDAB in the last 72 hours.  Invalid input(s): FREET3 ------------------------------------------------------------------------------------------------------------------ No results for input(s): VITAMINB12, FOLATE, FERRITIN, TIBC, IRON, RETICCTPCT in the last 72 hours.  Coagulation profile No results for input(s): INR, PROTIME in the last 168 hours.  No results for input(s): DDIMER in the last 72 hours.  Cardiac Enzymes  Recent Labs Lab 07/23/15 2230 07/24/15 0356 07/24/15 1010  TROPONINI <0.03 <0.03 <0.03   ------------------------------------------------------------------------------------------------------------------ Invalid input(s): POCBNP     Time Spent in minutes   30 minutes   Eleisha Branscomb M.D on 07/27/2015 at 1:06 PM  Between 7am to 7pm - Pager - [phone]  After 7pm go to [email or web address] - password [facility]  [facility]   Office  [phone]

## 2015-07-28 LAB — BASIC METABOLIC PANEL
ANION GAP: 9 (ref 5–15)
BUN: 15 mg/dL (ref 6–20)
CO2: 23 mmol/L (ref 22–32)
Calcium: 9.1 mg/dL (ref 8.9–10.3)
Chloride: 115 mmol/L — ABNORMAL HIGH (ref 101–111)
Creatinine, Ser: 0.94 mg/dL (ref 0.44–1.00)
GFR calc Af Amer: >60 mL/min (ref >60)
GFR calc non Af Amer: >60 mL/min (ref >60)
GLUCOSE: 99 mg/dL (ref 65–99)
POTASSIUM: 4.4 mmol/L (ref 3.5–5.1)
Sodium: 147 mmol/L — ABNORMAL HIGH (ref 135–145)

## 2015-07-28 LAB — CULTURE, BLOOD (ROUTINE X 2)
CULTURE: NO GROWTH
Culture: NO GROWTH

## 2015-07-28 MED ORDER — AMOXICILLIN-POT CLAVULANATE 875-125 MG PO TABS
1.0000 | ORAL_TABLET | Freq: Two times a day (BID) | ORAL | Status: DC
  Administered 2015-07-28 – 2015-07-29 (×3): 1 via ORAL
  Filled 2015-07-28 (×3): qty 1

## 2015-07-28 MED ORDER — DEXTROSE-NACL 5-0.45 % IV SOLN
INTRAVENOUS | Status: DC
  Administered 2015-07-28: 17:00:00 via INTRAVENOUS

## 2015-07-28 NOTE — Discharge Instructions (Signed)
Follow with Primary MD  in 7 days  - Patient is encouraged to drink large amount of fluids to keep with her urinary output secondary to diabetes insipidus  Get CBC, CMP, 2 view Chest X ray checked  by Primary MD next visit.    Activity: As tolerated with Full fall precautions use walker/cane & assistance as needed   Disposition ALF   Diet: Heart Healthy  , with feeding assistance and aspiration precautions.    On your next visit with your primary care physician please Get Medicines reviewed and adjusted.   Please request your Prim.MD to go over all Hospital Tests and Procedure/Radiological results at the follow up, please get all Hospital records sent to your Prim MD by signing hospital release before you go home.   If you experience worsening of your admission symptoms, develop shortness of breath, life threatening emergency, suicidal or homicidal thoughts you must seek medical attention immediately by calling 911 or calling your MD immediately  if symptoms less severe.  You Must read complete instructions/literature along with all the possible adverse reactions/side effects for all the Medicines you take and that have been prescribed to you. Take any new Medicines after you have completely understood and accpet all the possible adverse reactions/side effects.   Do not drive, operating heavy machinery, perform activities at heights, swimming or participation in water activities or provide baby sitting services if your were admitted for syncope or siezures until you have seen by Primary MD or a Neurologist and advised to do so again.  Do not drive when taking Pain medications.    Do not take more than prescribed Pain, Sleep and Anxiety Medications  Special Instructions: If you have smoked or chewed Tobacco  in the last 2 yrs please stop smoking, stop any regular Alcohol  and or any Recreational drug use.  Wear Seat belts while driving.   Please note  You were cared for by a  hospitalist during your hospital stay. If you have any questions about your discharge medications or the care you received while you were in the hospital after you are discharged, you can call the unit and asked to speak with the hospitalist on call if the hospitalist that took care of you is not available. Once you are discharged, your primary care physician will handle any further medical issues. Please note that NO REFILLS for any discharge medications will be authorized once you are discharged, as it is imperative that you return to your primary care physician (or establish a relationship with a primary care physician if you do not have one) for your aftercare needs so that they can reassess your need for medications and monitor your lab values.

## 2015-07-28 NOTE — Progress Notes (Signed)
Patient Demographics  Jama Mcmiller, is a 64 y.o. female, DOB - July 03, 1951, ZOX:[id number]  Admit date - 07/23/2015   Admitting Physician Eduard Clos, MD  Outpatient Primary MD for the patient is No primary care provider on file.  LOS - 5   Chief Complaint  Patient presents with  . Fever         Subjective:   Aprile Dickenson today has, No headache, No chest pain, No abdominal pain - No Nausea, No new weakness tingling or numbness, denies cough and shortness of breath , port she has less urinary output.  Assessment & Plan    Principal Problem:   Sepsis Active Problems:   Pneumonia   COPD (chronic obstructive pulmonary disease)   Bipolar disorder   Nephrogenic diabetes insipidus  Sepsis secondary to pneumonia - Treated with IV vancomycin and Zosyn for healthcare associated pneumonia, given negative blood cultures transitioned to oral Augmentin-  Legionella , HIV and strep P antigen are negative -  blood cultures NGTD -  on pulmonary toilette, flutter valve and Mucinex  COPD - No active wheezing, continue with nebs, continue with Advair  Acute renal failure - due to volume depletion and infection,resolved  Hypernatremia  - In the setting of nephrogenic diabetes insipidus.  Nephrogenic Diabetes insipidus  - Patient with significant urine output(8 L yesterday), has low urine osmolality, and elevated sodium osmolality, picture compatible with nephrogenic diabetes insipidus. - Nephrology consult appreciated - Lithium has been stopped as discussed with psychiatric - Patient with significant urinary output, but appears to be trending down after stopping lithium 10L >>8L>> 6L , patient encouraged to have plenty of fluid intake, will check BMP in a.m.Marland Kitchen  Bipolar disorder - stopped patient lithium giving her nephrogenic diabetes insipidus, started her on Depakote 500 mg twice a day  instead as discussed with inpatient psychiatry Dr. Shela Commons, will continue with olanzapine . - Reports she has a follow-up with [facility] psychiatrically on 8/24, instructed to keep that appointment.  Elevated blood glucose - No history of diabetes mellitus, glucose level are acceptable during hospital stay   Code Status: Full  Family Communication: None at bedside  Disposition Plan: Discharge to ALF in am.  Procedures  None   Consults   None   Medications  Scheduled Meds: . amoxicillin-clavulanate  1 tablet Oral Q12H  . antiseptic oral rinse  7 mL Mouth Rinse BID  . enoxaparin (LOVENOX) injection  40 mg Subcutaneous Q24H  . feeding supplement (ENSURE ENLIVE)  237 mL Oral BID BM  . guaiFENesin  600 mg Oral BID  . ipratropium-albuterol  3 mL Nebulization Q6H  . mometasone-formoterol  2 puff Inhalation BID  . OLANZapine  15 mg Oral QHS  . OLANZapine  5 mg Oral Daily  . topiramate  25 mg Oral Daily  . topiramate  50 mg Oral QHS  . valproic acid  500 mg Oral BID   Continuous Infusions: . dextrose 5 % and 0.45% NaCl     PRN Meds:.albuterol  DVT Prophylaxis  Lovenox -  Lab Results  Component Value Date   PLT 204 07/26/2015    Antibiotics    Anti-infectives    Start     Dose/Rate Route Frequency Ordered Stop   07/28/15 1545  amoxicillin-clavulanate (AUGMENTIN) 875-125 MG per tablet 1 tablet     1 tablet Oral Every 12 hours 07/28/15 1534     07/24/15 1800  vancomycin (VANCOCIN) IVPB 750 mg/150 ml premix  Status:  Discontinued     750 mg 150 mL/hr over 60 Minutes Intravenous Every 24 hours 07/23/15 1914 07/26/15 0740   07/24/15 0100  piperacillin-tazobactam (ZOSYN) IVPB 3.375 g  Status:  Discontinued     3.375 g 12.5 mL/hr over 240 Minutes Intravenous Every 8 hours 07/23/15 1914 07/28/15 1534   07/23/15 2215  vancomycin (VANCOCIN) IVPB 1000 mg/200 mL premix  Status:  Discontinued     1,000 mg 200 mL/hr over 60 Minutes Intravenous  Once 07/23/15 2208 07/23/15 2210    07/23/15 2215  piperacillin-tazobactam (ZOSYN) IVPB 3.375 g  Status:  Discontinued     3.375 g 100 mL/hr over 30 Minutes Intravenous 3 times per day 07/23/15 2208 07/23/15 2210   07/23/15 1745  piperacillin-tazobactam (ZOSYN) IVPB 3.375 g     3.375 g 100 mL/hr over 30 Minutes Intravenous  Once 07/23/15 1732 07/23/15 1807   07/23/15 1730  vancomycin (VANCOCIN) IVPB 1000 mg/200 mL premix     1,000 mg 200 mL/hr over 60 Minutes Intravenous  Once 07/23/15 1727 07/23/15 1937   07/23/15 1730  piperacillin-tazobactam (ZOSYN) IVPB 3.375 g  Status:  Discontinued     3.375 g 100 mL/hr over 30 Minutes Intravenous 3 times per day 07/23/15 1727 07/23/15 1732          Objective:   Filed Vitals:   07/28/15 0145 07/28/15 0530 07/28/15 0842 07/28/15 1405  BP:  137/82  117/86  Pulse:  84  80  Temp:  98.3 F (36.8 C)  98.6 F (37 C)  TempSrc:  Oral    Resp:  20  19  Height:      Weight:      SpO2: 96% 96% 94% 98%    Wt Readings from Last 3 Encounters:  07/23/15 47.174 kg (104 lb)  01/11/12 50.349 kg (111 lb)     Intake/Output Summary (Last 24 hours) at 07/28/15 1534 Last data filed at 07/28/15 1452  Gross per 24 hour  Intake   1530 ml  Output   5200 ml  Net  -3670 ml     Physical Exam  Awake Alert, Oriented X 3, No new F.N deficits, Normal affect [place].AT,PERRAL Supple Neck,No JVD, No cervical lymphadenopathy appriciated.  Symmetrical Chest wall movement, Good air movement bilaterally, no wheezing RRR,No Gallops,Rubs or new Murmurs, No Parasternal Heave +ve B.Sounds, Abd Soft, No tenderness, No organomegaly appriciated, No rebound - guarding or rigidity. No Cyanosis, Clubbing or edema, No new Rash or bruise     Data Review   Micro Results Recent Results (from the past 240 hour(s))  Culture, blood (routine x 2)     Status: None (Preliminary result)   Collection Time: 07/23/15  5:10 PM  Result Value Ref Range Status   Specimen Description BLOOD LEFT ARM  Final   Special  Requests BOTTLES DRAWN AEROBIC AND ANAEROBIC  Final   Culture NO GROWTH 4 DAYS  Final   Report Status PENDING  Incomplete  Culture, blood (routine x 2)     Status: None (Preliminary result)   Collection Time: 07/23/15  5:35 PM  Result Value Ref Range Status   Specimen Description BLOOD RIGHT ARM  Final   Special Requests BOTTLES DRAWN AEROBIC AND ANAEROBIC 5CC  Final   Culture NO GROWTH 4 DAYS  Final   Report Status PENDING  Incomplete  Urine culture     Status: None   Collection Time: 07/23/15  7:40 PM  Result Value Ref Range Status   Specimen Description URINE, RANDOM  Final   Special Requests NONE  Final   Culture NO GROWTH 2 DAYS  Final   Report Status 07/25/2015 FINAL  Final  MRSA PCR Screening     Status: None   Collection Time: 07/24/15  6:02 AM  Result Value Ref Range Status   MRSA by PCR NEGATIVE NEGATIVE Final    Comment:        The GeneXpert MRSA Assay (FDA approved for NASAL specimens only), is one component of a comprehensive MRSA colonization surveillance program. It is not intended to diagnose MRSA infection nor to guide or monitor treatment for MRSA infections.     Radiology Reports Dg Chest Port 1 View  07/23/2015   CLINICAL DATA:  Cough.  Shortness of breath.  Fever.  EXAM: PORTABLE CHEST - 1 VIEW  COMPARISON:  01/22/2007  FINDINGS: Tapering of the peripheral pulmonary vasculature favors emphysema. Atherosclerotic calcification of the aortic arch is present.  Heart size within normal limits. Mild symmetric interstitial accentuation of both lung bases.  IMPRESSION: 1. Emphysema. 2. Faint interstitial accentuation of both lung bases is technically nonspecific but could be a manifestation of drug reaction, subtle edema, or atypical infectious process.   Electronically Signed   By: Gaylyn Rong M.D.   On: 07/23/2015 17:57     CBC  Recent Labs Lab 07/23/15 1710 07/23/15 2230 07/24/15 0356 07/25/15 0607 07/26/15 0528  WBC 22.5* 14.3* 21.4* 15.5*  12.8*  HGB 13.7 12.7 13.2 10.8* 11.0*  HCT 42.3 39.8 40.9 34.4* 35.4*  PLT 232 221 223 197 204  MCV 104.2* 103.6* 104.9* 105.5* 106.3*  MCH 33.7 33.1 33.8 33.1 33.0  MCHC 32.4 31.9 32.3 31.4 31.1  RDW 13.5 13.6 13.7 14.0 14.2  LYMPHSABS 1.1  --  0.9  --   --   MONOABS 2.5*  --  2.4*  --   --   EOSABS 0.0  --  0.0  --   --   BASOSABS 0.0  --  0.0  --   --     Chemistries   Recent Labs Lab 07/23/15 1710  07/24/15 0356 07/25/15 0607 07/26/15 0528 07/27/15 0511 07/28/15 0520  NA 135  --  141 147* 145 146* 147*  K 4.2  --  3.9 3.9 3.8 3.9 4.4  CL 106  --  113* 122* 117* 118* 115*  CO2 17*  --  22 22 22 22 23   GLUCOSE 202*  --  153* 112* 119* 110* 99  BUN 9  --  8 9 <5* 7 15  CREATININE 1.11*  < > 1.01* 0.87 0.88 0.78 0.94  CALCIUM 9.3  --  9.0 8.6* 8.6* 8.6* 9.1  AST 25  --  24  --   --   --   --   ALT 13*  --  15  --   --   --   --   ALKPHOS 79  --  57  --   --   --   --   BILITOT 0.5  --  0.5  --   --   --   --   < > = values in this interval not displayed. ------------------------------------------------------------------------------------------------------------------ estimated creatinine clearance is 45.6 mL/min (by C-G formula based on Cr of 0.94). ------------------------------------------------------------------------------------------------------------------ No results for input(s):  HGBA1C in the last 72 hours. ------------------------------------------------------------------------------------------------------------------ No results for input(s): CHOL, HDL, LDLCALC, TRIG, CHOLHDL, LDLDIRECT in the last 72 hours. ------------------------------------------------------------------------------------------------------------------ No results for input(s): TSH, T4TOTAL, T3FREE, THYROIDAB in the last 72 hours.  Invalid input(s): FREET3 ------------------------------------------------------------------------------------------------------------------ No results for  input(s): VITAMINB12, FOLATE, FERRITIN, TIBC, IRON, RETICCTPCT in the last 72 hours.  Coagulation profile No results for input(s): INR, PROTIME in the last 168 hours.  No results for input(s): DDIMER in the last 72 hours.  Cardiac Enzymes  Recent Labs Lab 07/23/15 2230 07/24/15 0356 07/24/15 1010  TROPONINI <0.03 <0.03 <0.03   ------------------------------------------------------------------------------------------------------------------ Invalid input(s): POCBNP     Time Spent in minutes   30 minutes   ELGERGAWY, DAWOOD M.D on 07/28/2015 at 3:34 PM  Between 7am to 7pm - Pager - [phone]  After 7pm go to [email or web address] - password [facility]  [facility]   Office  [phone]                                                                                         Patient Demographics  Braidyn Peace, is a 64 y.o. female, DOB - Apr 29, 1951, GNF:[id number]  Admit date - 07/23/2015   Admitting Physician Eduard Clos, MD  Outpatient Primary MD for the patient is No primary care provider on file.  LOS - 5   Chief Complaint  Patient presents with  . Fever         Subjective:   Myliyah Rebuck today has, No headache, No chest pain, No abdominal pain - No Nausea, No new weakness tingling or numbness, cough and shortness of breath are improving, a shunt has significant urinary output yesterday, and reports she is constantly thirsty.  Assessment & Plan    Principal Problem:   Sepsis Active Problems:   Pneumonia   COPD (chronic obstructive pulmonary disease)   Bipolar disorder   Nephrogenic diabetes insipidus  Sepsis secondary to pneumonia - Treated with IV vancomycin and Zosyn for healthcare associated pneumonia, given negative blood cultures, will stop IV vancomycin, continue with Zosyn -  Legionella , HIV and strep P antigen are negative -  blood cultures NGTD -  on pulmonary toilette, flutter valve and Mucinex  COPD - No active wheezing,  continue with nebs, continue with Advair  Acute renal failure - due to volume depletion and infection,resolved  Hypernatremia  - Resolved with D5 half-normal saline .  Nephrogenic Diabetes insipidus  - Patient with significant urine output(10 L yesterday), has low urine osmolality, and elevated sodium osmolality, picture compatible with nephrogenic diabetes insipidus. - Nephrology consult appreciated - We'll discontinue lithium - Patient has free access to water and liquids  Bipolar disorder - Will start patient lithium giving her nephrogenic diabetes insipidus, will start her on Depakote 500 mg twice a day instead as discussed with inpatient psychiatrically Dr. Shela Commons, will continue with olanzapine .  Elevated blood glucose - No history of diabetes mellitus, check hemoglobin A1c, monitor CBG closely.   Code Status: Full  Family Communication: None at bedside  Disposition Plan: Home once stable   Procedures  None   Consults   None   Medications  Scheduled Meds: .  amoxicillin-clavulanate  1 tablet Oral Q12H  . antiseptic oral rinse  7 mL Mouth Rinse BID  . enoxaparin (LOVENOX) injection  40 mg Subcutaneous Q24H  . feeding supplement (ENSURE ENLIVE)  237 mL Oral BID BM  . guaiFENesin  600 mg Oral BID  . ipratropium-albuterol  3 mL Nebulization Q6H  . mometasone-formoterol  2 puff Inhalation BID  . OLANZapine  15 mg Oral QHS  . OLANZapine  5 mg Oral Daily  . topiramate  25 mg Oral Daily  . topiramate  50 mg Oral QHS  . valproic acid  500 mg Oral BID   Continuous Infusions: . dextrose 5 % and 0.45% NaCl     PRN Meds:.albuterol  DVT Prophylaxis  Lovenox -  Lab Results  Component Value Date   PLT 204 07/26/2015    Antibiotics    Anti-infectives    Start     Dose/Rate Route Frequency Ordered Stop   07/28/15 1545  amoxicillin-clavulanate (AUGMENTIN) 875-125 MG per tablet 1 tablet     1 tablet Oral Every 12 hours 07/28/15 1534     07/24/15 1800  vancomycin  (VANCOCIN) IVPB 750 mg/150 ml premix  Status:  Discontinued     750 mg 150 mL/hr over 60 Minutes Intravenous Every 24 hours 07/23/15 1914 07/26/15 0740   07/24/15 0100  piperacillin-tazobactam (ZOSYN) IVPB 3.375 g  Status:  Discontinued     3.375 g 12.5 mL/hr over 240 Minutes Intravenous Every 8 hours 07/23/15 1914 07/28/15 1534   07/23/15 2215  vancomycin (VANCOCIN) IVPB 1000 mg/200 mL premix  Status:  Discontinued     1,000 mg 200 mL/hr over 60 Minutes Intravenous  Once 07/23/15 2208 07/23/15 2210   07/23/15 2215  piperacillin-tazobactam (ZOSYN) IVPB 3.375 g  Status:  Discontinued     3.375 g 100 mL/hr over 30 Minutes Intravenous 3 times per day 07/23/15 2208 07/23/15 2210   07/23/15 1745  piperacillin-tazobactam (ZOSYN) IVPB 3.375 g     3.375 g 100 mL/hr over 30 Minutes Intravenous  Once 07/23/15 1732 07/23/15 1807   07/23/15 1730  vancomycin (VANCOCIN) IVPB 1000 mg/200 mL premix     1,000 mg 200 mL/hr over 60 Minutes Intravenous  Once 07/23/15 1727 07/23/15 1937   07/23/15 1730  piperacillin-tazobactam (ZOSYN) IVPB 3.375 g  Status:  Discontinued     3.375 g 100 mL/hr over 30 Minutes Intravenous 3 times per day 07/23/15 1727 07/23/15 1732          Objective:   Filed Vitals:   07/28/15 0145 07/28/15 0530 07/28/15 0842 07/28/15 1405  BP:  137/82  117/86  Pulse:  84  80  Temp:  98.3 F (36.8 C)  98.6 F (37 C)  TempSrc:  Oral    Resp:  20  19  Height:      Weight:      SpO2: 96% 96% 94% 98%    Wt Readings from Last 3 Encounters:  07/23/15 47.174 kg (104 lb)  01/11/12 50.349 kg (111 lb)     Intake/Output Summary (Last 24 hours) at 07/28/15 1534 Last data filed at 07/28/15 1452  Gross per 24 hour  Intake   1530 ml  Output   5200 ml  Net  -3670 ml     Physical Exam  Awake Alert, Oriented X 3, No new F.N deficits, Normal affect [place].AT,PERRAL Supple Neck,No JVD, No cervical lymphadenopathy appriciated.  Symmetrical Chest wall movement, Good air movement  bilaterally, no wheezing RRR,No Gallops,Rubs or new Murmurs, No Parasternal  Heave +ve B.Sounds, Abd Soft, No tenderness, No organomegaly appriciated, No rebound - guarding or rigidity. No Cyanosis, Clubbing or edema, No new Rash or bruise     Data Review   Micro Results Recent Results (from the past 240 hour(s))  Culture, blood (routine x 2)     Status: None (Preliminary result)   Collection Time: 07/23/15  5:10 PM  Result Value Ref Range Status   Specimen Description BLOOD LEFT ARM  Final   Special Requests BOTTLES DRAWN AEROBIC AND ANAEROBIC  Final   Culture NO GROWTH 4 DAYS  Final   Report Status PENDING  Incomplete  Culture, blood (routine x 2)     Status: None (Preliminary result)   Collection Time: 07/23/15  5:35 PM  Result Value Ref Range Status   Specimen Description BLOOD RIGHT ARM  Final   Special Requests BOTTLES DRAWN AEROBIC AND ANAEROBIC 5CC  Final   Culture NO GROWTH 4 DAYS  Final   Report Status PENDING  Incomplete  Urine culture     Status: None   Collection Time: 07/23/15  7:40 PM  Result Value Ref Range Status   Specimen Description URINE, RANDOM  Final   Special Requests NONE  Final   Culture NO GROWTH 2 DAYS  Final   Report Status 07/25/2015 FINAL  Final  MRSA PCR Screening     Status: None   Collection Time: 07/24/15  6:02 AM  Result Value Ref Range Status   MRSA by PCR NEGATIVE NEGATIVE Final    Comment:        The GeneXpert MRSA Assay (FDA approved for NASAL specimens only), is one component of a comprehensive MRSA colonization surveillance program. It is not intended to diagnose MRSA infection nor to guide or monitor treatment for MRSA infections.     Radiology Reports Dg Chest Port 1 View  07/23/2015   CLINICAL DATA:  Cough.  Shortness of breath.  Fever.  EXAM: PORTABLE CHEST - 1 VIEW  COMPARISON:  01/22/2007  FINDINGS: Tapering of the peripheral pulmonary vasculature favors emphysema. Atherosclerotic calcification of the aortic arch is  present.  Heart size within normal limits. Mild symmetric interstitial accentuation of both lung bases.  IMPRESSION: 1. Emphysema. 2. Faint interstitial accentuation of both lung bases is technically nonspecific but could be a manifestation of drug reaction, subtle edema, or atypical infectious process.   Electronically Signed   By: Gaylyn Rong M.D.   On: 07/23/2015 17:57     CBC  Recent Labs Lab 07/23/15 1710 07/23/15 2230 07/24/15 0356 07/25/15 0607 07/26/15 0528  WBC 22.5* 14.3* 21.4* 15.5* 12.8*  HGB 13.7 12.7 13.2 10.8* 11.0*  HCT 42.3 39.8 40.9 34.4* 35.4*  PLT 232 221 223 197 204  MCV 104.2* 103.6* 104.9* 105.5* 106.3*  MCH 33.7 33.1 33.8 33.1 33.0  MCHC 32.4 31.9 32.3 31.4 31.1  RDW 13.5 13.6 13.7 14.0 14.2  LYMPHSABS 1.1  --  0.9  --   --   MONOABS 2.5*  --  2.4*  --   --   EOSABS 0.0  --  0.0  --   --   BASOSABS 0.0  --  0.0  --   --     Chemistries   Recent Labs Lab 07/23/15 1710  07/24/15 0356 07/25/15 0607 07/26/15 0528 07/27/15 0511 07/28/15 0520  NA 135  --  141 147* 145 146* 147*  K 4.2  --  3.9 3.9 3.8 3.9 4.4  CL 106  --  113*  122* 117* 118* 115*  CO2 17*  --  22 22 22 22 23   GLUCOSE 202*  --  153* 112* 119* 110* 99  BUN 9  --  8 9 <5* 7 15  CREATININE 1.11*  < > 1.01* 0.87 0.88 0.78 0.94  CALCIUM 9.3  --  9.0 8.6* 8.6* 8.6* 9.1  AST 25  --  24  --   --   --   --   ALT 13*  --  15  --   --   --   --   ALKPHOS 79  --  57  --   --   --   --   BILITOT 0.5  --  0.5  --   --   --   --   < > = values in this interval not displayed. ------------------------------------------------------------------------------------------------------------------ estimated creatinine clearance is 45.6 mL/min (by C-G formula based on Cr of 0.94). ------------------------------------------------------------------------------------------------------------------ No results for input(s): HGBA1C in the last 72  hours. ------------------------------------------------------------------------------------------------------------------ No results for input(s): CHOL, HDL, LDLCALC, TRIG, CHOLHDL, LDLDIRECT in the last 72 hours. ------------------------------------------------------------------------------------------------------------------ No results for input(s): TSH, T4TOTAL, T3FREE, THYROIDAB in the last 72 hours.  Invalid input(s): FREET3 ------------------------------------------------------------------------------------------------------------------ No results for input(s): VITAMINB12, FOLATE, FERRITIN, TIBC, IRON, RETICCTPCT in the last 72 hours.  Coagulation profile No results for input(s): INR, PROTIME in the last 168 hours.  No results for input(s): DDIMER in the last 72 hours.  Cardiac Enzymes  Recent Labs Lab 07/23/15 2230 07/24/15 0356 07/24/15 1010  TROPONINI <0.03 <0.03 <0.03   ------------------------------------------------------------------------------------------------------------------ Invalid input(s): POCBNP     Time Spent in minutes   30 minutes   ELGERGAWY, DAWOOD M.D on 07/28/2015 at 3:34 PM  Between 7am to 7pm - Pager - [phone]  After 7pm go to [email or web address] - password [email or web address]  [facility]   Office  [phone]                                                                                         Patient Demographics  Rosabelle Jupin, is a 64 y.o. female, DOB - May 22, 1951, XBM:[id number]  Admit date - 07/23/2015   Admitting Physician Eduard Clos, MD  Outpatient Primary MD for the patient is No primary care provider on file.  LOS - 5   Chief Complaint  Patient presents with  . Fever         Subjective:   Apple Dearmas today has, No headache, No chest pain, No abdominal pain - No Nausea, No new weakness tingling or numbness, cough and shortness of breath are improving, a shunt has significant urinary output yesterday, and  reports she is constantly thirsty.  Assessment & Plan    Principal Problem:   Sepsis Active Problems:   Pneumonia   COPD (chronic obstructive pulmonary disease)   Bipolar disorder   Nephrogenic diabetes insipidus  Sepsis secondary to pneumonia - Treated with IV vancomycin and Zosyn for healthcare associated pneumonia, given negative blood cultures, will stop IV vancomycin, continue with Zosyn -  Legionella , HIV and strep P antigen are negative -  blood cultures NGTD -  on pulmonary toilette, flutter valve and  Mucinex  COPD - No active wheezing, continue with nebs, continue with Advair  Acute renal failure - due to volume depletion and infection,resolved  Hypernatremia  - Resolved with D5 half-normal saline .  Nephrogenic Diabetes insipidus  - Patient with significant urine output(10 L yesterday), has low urine osmolality, and elevated sodium osmolality, picture compatible with nephrogenic diabetes insipidus. - Nephrology consult appreciated - We'll discontinue lithium - Patient has free access to water and liquids  Bipolar disorder - Will start patient lithium giving her nephrogenic diabetes insipidus, will start her on Depakote 500 mg twice a day instead as discussed with inpatient psychiatrically Dr. Shela Commons, will continue with olanzapine .  Elevated blood glucose - No history of diabetes mellitus, check hemoglobin A1c, monitor CBG closely.   Code Status: Full  Family Communication: None at bedside  Disposition Plan: Home once stable   Procedures  None   Consults   None   Medications  Scheduled Meds: . amoxicillin-clavulanate  1 tablet Oral Q12H  . antiseptic oral rinse  7 mL Mouth Rinse BID  . enoxaparin (LOVENOX) injection  40 mg Subcutaneous Q24H  . feeding supplement (ENSURE ENLIVE)  237 mL Oral BID BM  . guaiFENesin  600 mg Oral BID  . ipratropium-albuterol  3 mL Nebulization Q6H  . mometasone-formoterol  2 puff Inhalation BID  . OLANZapine  15 mg  Oral QHS  . OLANZapine  5 mg Oral Daily  . topiramate  25 mg Oral Daily  . topiramate  50 mg Oral QHS  . valproic acid  500 mg Oral BID   Continuous Infusions: . dextrose 5 % and 0.45% NaCl     PRN Meds:.albuterol  DVT Prophylaxis  Lovenox -  Lab Results  Component Value Date   PLT 204 07/26/2015    Antibiotics    Anti-infectives    Start     Dose/Rate Route Frequency Ordered Stop   07/28/15 1545  amoxicillin-clavulanate (AUGMENTIN) 875-125 MG per tablet 1 tablet     1 tablet Oral Every 12 hours 07/28/15 1534     07/24/15 1800  vancomycin (VANCOCIN) IVPB 750 mg/150 ml premix  Status:  Discontinued     750 mg 150 mL/hr over 60 Minutes Intravenous Every 24 hours 07/23/15 1914 07/26/15 0740   07/24/15 0100  piperacillin-tazobactam (ZOSYN) IVPB 3.375 g  Status:  Discontinued     3.375 g 12.5 mL/hr over 240 Minutes Intravenous Every 8 hours 07/23/15 1914 07/28/15 1534   07/23/15 2215  vancomycin (VANCOCIN) IVPB 1000 mg/200 mL premix  Status:  Discontinued     1,000 mg 200 mL/hr over 60 Minutes Intravenous  Once 07/23/15 2208 07/23/15 2210   07/23/15 2215  piperacillin-tazobactam (ZOSYN) IVPB 3.375 g  Status:  Discontinued     3.375 g 100 mL/hr over 30 Minutes Intravenous 3 times per day 07/23/15 2208 07/23/15 2210   07/23/15 1745  piperacillin-tazobactam (ZOSYN) IVPB 3.375 g     3.375 g 100 mL/hr over 30 Minutes Intravenous  Once 07/23/15 1732 07/23/15 1807   07/23/15 1730  vancomycin (VANCOCIN) IVPB 1000 mg/200 mL premix     1,000 mg 200 mL/hr over 60 Minutes Intravenous  Once 07/23/15 1727 07/23/15 1937   07/23/15 1730  piperacillin-tazobactam (ZOSYN) IVPB 3.375 g  Status:  Discontinued     3.375 g 100 mL/hr over 30 Minutes Intravenous 3 times per day 07/23/15 1727 07/23/15 1732          Objective:   Filed Vitals:   07/28/15 0145 07/28/15  0530 07/28/15 0842 07/28/15 1405  BP:  137/82  117/86  Pulse:  84  80  Temp:  98.3 F (36.8 C)  98.6 F (37 C)  TempSrc:   Oral    Resp:  20  19  Height:      Weight:      SpO2: 96% 96% 94% 98%    Wt Readings from Last 3 Encounters:  07/23/15 47.174 kg (104 lb)  01/11/12 50.349 kg (111 lb)     Intake/Output Summary (Last 24 hours) at 07/28/15 1534 Last data filed at 07/28/15 1452  Gross per 24 hour  Intake   1530 ml  Output   5200 ml  Net  -3670 ml     Physical Exam  Awake Alert, Oriented X 3, No new F.N deficits, Normal affect [place].AT,PERRAL Supple Neck,No JVD, No cervical lymphadenopathy appriciated.  Symmetrical Chest wall movement, Good air movement bilaterally, no wheezing RRR,No Gallops,Rubs or new Murmurs, No Parasternal Heave +ve B.Sounds, Abd Soft, No tenderness, No organomegaly appriciated, No rebound - guarding or rigidity. No Cyanosis, Clubbing or edema, No new Rash or bruise     Data Review   Micro Results Recent Results (from the past 240 hour(s))  Culture, blood (routine x 2)     Status: None (Preliminary result)   Collection Time: 07/23/15  5:10 PM  Result Value Ref Range Status   Specimen Description BLOOD LEFT ARM  Final   Special Requests BOTTLES DRAWN AEROBIC AND ANAEROBIC  Final   Culture NO GROWTH 4 DAYS  Final   Report Status PENDING  Incomplete  Culture, blood (routine x 2)     Status: None (Preliminary result)   Collection Time: 07/23/15  5:35 PM  Result Value Ref Range Status   Specimen Description BLOOD RIGHT ARM  Final   Special Requests BOTTLES DRAWN AEROBIC AND ANAEROBIC 5CC  Final   Culture NO GROWTH 4 DAYS  Final   Report Status PENDING  Incomplete  Urine culture     Status: None   Collection Time: 07/23/15  7:40 PM  Result Value Ref Range Status   Specimen Description URINE, RANDOM  Final   Special Requests NONE  Final   Culture NO GROWTH 2 DAYS  Final   Report Status 07/25/2015 FINAL  Final  MRSA PCR Screening     Status: None   Collection Time: 07/24/15  6:02 AM  Result Value Ref Range Status   MRSA by PCR NEGATIVE NEGATIVE Final     Comment:        The GeneXpert MRSA Assay (FDA approved for NASAL specimens only), is one component of a comprehensive MRSA colonization surveillance program. It is not intended to diagnose MRSA infection nor to guide or monitor treatment for MRSA infections.     Radiology Reports Dg Chest Port 1 View  07/23/2015   CLINICAL DATA:  Cough.  Shortness of breath.  Fever.  EXAM: PORTABLE CHEST - 1 VIEW  COMPARISON:  01/22/2007  FINDINGS: Tapering of the peripheral pulmonary vasculature favors emphysema. Atherosclerotic calcification of the aortic arch is present.  Heart size within normal limits. Mild symmetric interstitial accentuation of both lung bases.  IMPRESSION: 1. Emphysema. 2. Faint interstitial accentuation of both lung bases is technically nonspecific but could be a manifestation of drug reaction, subtle edema, or atypical infectious process.   Electronically Signed   By: Gaylyn Rong M.D.   On: 07/23/2015 17:57     CBC  Recent Labs Lab 07/23/15 1710 07/23/15 2230  07/24/15 0356 07/25/15 0607 07/26/15 0528  WBC 22.5* 14.3* 21.4* 15.5* 12.8*  HGB 13.7 12.7 13.2 10.8* 11.0*  HCT 42.3 39.8 40.9 34.4* 35.4*  PLT 232 221 223 197 204  MCV 104.2* 103.6* 104.9* 105.5* 106.3*  MCH 33.7 33.1 33.8 33.1 33.0  MCHC 32.4 31.9 32.3 31.4 31.1  RDW 13.5 13.6 13.7 14.0 14.2  LYMPHSABS 1.1  --  0.9  --   --   MONOABS 2.5*  --  2.4*  --   --   EOSABS 0.0  --  0.0  --   --   BASOSABS 0.0  --  0.0  --   --     Chemistries   Recent Labs Lab 07/23/15 1710  07/24/15 0356 07/25/15 0607 07/26/15 0528 07/27/15 0511 07/28/15 0520  NA 135  --  141 147* 145 146* 147*  K 4.2  --  3.9 3.9 3.8 3.9 4.4  CL 106  --  113* 122* 117* 118* 115*  CO2 17*  --  22 22 22 22 23   GLUCOSE 202*  --  153* 112* 119* 110* 99  BUN 9  --  8 9 <5* 7 15  CREATININE 1.11*  < > 1.01* 0.87 0.88 0.78 0.94  CALCIUM 9.3  --  9.0 8.6* 8.6* 8.6* 9.1  AST 25  --  24  --   --   --   --   ALT 13*  --  15  --    --   --   --   ALKPHOS 79  --  57  --   --   --   --   BILITOT 0.5  --  0.5  --   --   --   --   < > = values in this interval not displayed. ------------------------------------------------------------------------------------------------------------------ estimated creatinine clearance is 45.6 mL/min (by C-G formula based on Cr of 0.94). ------------------------------------------------------------------------------------------------------------------ No results for input(s): HGBA1C in the last 72 hours. ------------------------------------------------------------------------------------------------------------------ No results for input(s): CHOL, HDL, LDLCALC, TRIG, CHOLHDL, LDLDIRECT in the last 72 hours. ------------------------------------------------------------------------------------------------------------------ No results for input(s): TSH, T4TOTAL, T3FREE, THYROIDAB in the last 72 hours.  Invalid input(s): FREET3 ------------------------------------------------------------------------------------------------------------------ No results for input(s): VITAMINB12, FOLATE, FERRITIN, TIBC, IRON, RETICCTPCT in the last 72 hours.  Coagulation profile No results for input(s): INR, PROTIME in the last 168 hours.  No results for input(s): DDIMER in the last 72 hours.  Cardiac Enzymes  Recent Labs Lab 07/23/15 2230 07/24/15 0356 07/24/15 1010  TROPONINI <0.03 <0.03 <0.03   ------------------------------------------------------------------------------------------------------------------ Invalid input(s): POCBNP     Time Spent in minutes   30 minutes   ELGERGAWY, DAWOOD M.D on 07/28/2015 at 3:34 PM  Between 7am to 7pm - Pager - [phone]  After 7pm go to [email or web address] - password [facility]  [facility]   Office  [phone]                                                                                         Patient Demographics  Tashona Calk, is a 64  y.o. female, DOB - Sep 04, 1951, [id number]  Admit date - 07/23/2015   Admitting Physician Eduard Clos, MD  Outpatient Primary MD for the patient is No primary care provider on file.  LOS - 5   Chief Complaint  Patient presents with  . Fever         Subjective:   Shynia Daleo today has, No headache, No chest pain, No abdominal pain - No Nausea, No new weakness tingling or numbness, cough and shortness of breath are improving, a shunt has significant urinary output yesterday, and reports she is constantly thirsty.  Assessment & Plan    Principal Problem:   Sepsis Active Problems:   Pneumonia   COPD (chronic obstructive pulmonary disease)   Bipolar disorder   Nephrogenic diabetes insipidus  Sepsis secondary to pneumonia - Treated with IV vancomycin and Zosyn for healthcare associated pneumonia, given negative blood cultures, will stop IV vancomycin, continue with Zosyn -  Legionella , HIV and strep P antigen are negative -  blood cultures NGTD -  on pulmonary toilette, flutter valve and Mucinex  COPD - No active wheezing, continue with nebs, continue with Advair  Acute renal failure - due to volume depletion and infection,resolved  Hypernatremia  - Resolved with D5 half-normal saline .  Nephrogenic Diabetes insipidus  - Patient with significant urine output(10 L yesterday), has low urine osmolality, and elevated sodium osmolality, picture compatible with nephrogenic diabetes insipidus. - Nephrology consult appreciated - We'll discontinue lithium - Patient has free access to water and liquids  Bipolar disorder - Will start patient lithium giving her nephrogenic diabetes insipidus, will start her on Depakote 500 mg twice a day instead as discussed with inpatient psychiatrically Dr. Shela Commons, will continue with olanzapine .  Elevated blood glucose - No history of diabetes mellitus, check hemoglobin A1c, monitor CBG closely.   Code Status: Full  Family  Communication: None at bedside  Disposition Plan: Home once stable   Procedures  None   Consults   None   Medications  Scheduled Meds: . amoxicillin-clavulanate  1 tablet Oral Q12H  . antiseptic oral rinse  7 mL Mouth Rinse BID  . enoxaparin (LOVENOX) injection  40 mg Subcutaneous Q24H  . feeding supplement (ENSURE ENLIVE)  237 mL Oral BID BM  . guaiFENesin  600 mg Oral BID  . ipratropium-albuterol  3 mL Nebulization Q6H  . mometasone-formoterol  2 puff Inhalation BID  . OLANZapine  15 mg Oral QHS  . OLANZapine  5 mg Oral Daily  . topiramate  25 mg Oral Daily  . topiramate  50 mg Oral QHS  . valproic acid  500 mg Oral BID   Continuous Infusions: . dextrose 5 % and 0.45% NaCl     PRN Meds:.acetaminophen **OR** acetaminophen, albuterol, ondansetron **OR** ondansetron (ZOFRAN) IV  DVT Prophylaxis  Lovenox -  Lab Results  Component Value Date   PLT 204 07/26/2015    Antibiotics    Anti-infectives    Start     Dose/Rate Route Frequency Ordered Stop   07/28/15 1545  amoxicillin-clavulanate (AUGMENTIN) 875-125 MG per tablet 1 tablet     1 tablet Oral Every 12 hours 07/28/15 1534     07/24/15 1800  vancomycin (VANCOCIN) IVPB 750 mg/150 ml premix  Status:  Discontinued     750 mg 150 mL/hr over 60 Minutes Intravenous Every 24 hours 07/23/15 1914 07/26/15 0740   07/24/15 0100  piperacillin-tazobactam (ZOSYN) IVPB 3.375 g  Status:  Discontinued     3.375 g 12.5 mL/hr over 240 Minutes Intravenous Every 8 hours 07/23/15 1914 07/28/15 1534   07/23/15 2215  vancomycin (VANCOCIN)  IVPB 1000 mg/200 mL premix  Status:  Discontinued     1,000 mg 200 mL/hr over 60 Minutes Intravenous  Once 07/23/15 2208 07/23/15 2210   07/23/15 2215  piperacillin-tazobactam (ZOSYN) IVPB 3.375 g  Status:  Discontinued     3.375 g 100 mL/hr over 30 Minutes Intravenous 3 times per day 07/23/15 2208 07/23/15 2210   07/23/15 1745  piperacillin-tazobactam (ZOSYN) IVPB 3.375 g     3.375 g 100 mL/hr  over 30 Minutes Intravenous  Once 07/23/15 1732 07/23/15 1807   07/23/15 1730  vancomycin (VANCOCIN) IVPB 1000 mg/200 mL premix     1,000 mg 200 mL/hr over 60 Minutes Intravenous  Once 07/23/15 1727 07/23/15 1937   07/23/15 1730  piperacillin-tazobactam (ZOSYN) IVPB 3.375 g  Status:  Discontinued     3.375 g 100 mL/hr over 30 Minutes Intravenous 3 times per day 07/23/15 1727 07/23/15 1732          Objective:   Filed Vitals:   07/28/15 0145 07/28/15 0530 07/28/15 0842 07/28/15 1405  BP:  137/82  117/86  Pulse:  84  80  Temp:  98.3 F (36.8 C)  98.6 F (37 C)  TempSrc:  Oral    Resp:  20  19  Height:      Weight:      SpO2: 96% 96% 94% 98%    Wt Readings from Last 3 Encounters:  07/23/15 47.174 kg (104 lb)  01/11/12 50.349 kg (111 lb)     Intake/Output Summary (Last 24 hours) at 07/28/15 1534 Last data filed at 07/28/15 1452  Gross per 24 hour  Intake   1530 ml  Output   5200 ml  Net  -3670 ml     Physical Exam  Awake Alert, Oriented X 3, No new F.N deficits, Normal affect [place].AT,PERRAL Supple Neck,No JVD, No cervical lymphadenopathy appriciated.  Symmetrical Chest wall movement, Good air movement bilaterally, no wheezing RRR,No Gallops,Rubs or new Murmurs, No Parasternal Heave +ve B.Sounds, Abd Soft, No tenderness, No organomegaly appriciated, No rebound - guarding or rigidity. No Cyanosis, Clubbing or edema, No new Rash or bruise     Data Review   Micro Results Recent Results (from the past 240 hour(s))  Culture, blood (routine x 2)     Status: None (Preliminary result)   Collection Time: 07/23/15  5:10 PM  Result Value Ref Range Status   Specimen Description BLOOD LEFT ARM  Final   Special Requests BOTTLES DRAWN AEROBIC AND ANAEROBIC  Final   Culture NO GROWTH 4 DAYS  Final   Report Status PENDING  Incomplete  Culture, blood (routine x 2)     Status: None (Preliminary result)   Collection Time: 07/23/15  5:35 PM  Result Value Ref Range Status    Specimen Description BLOOD RIGHT ARM  Final   Special Requests BOTTLES DRAWN AEROBIC AND ANAEROBIC 5CC  Final   Culture NO GROWTH 4 DAYS  Final   Report Status PENDING  Incomplete  Urine culture     Status: None   Collection Time: 07/23/15  7:40 PM  Result Value Ref Range Status   Specimen Description URINE, RANDOM  Final   Special Requests NONE  Final   Culture NO GROWTH 2 DAYS  Final   Report Status 07/25/2015 FINAL  Final  MRSA PCR Screening     Status: None   Collection Time: 07/24/15  6:02 AM  Result Value Ref Range Status   MRSA by PCR NEGATIVE NEGATIVE Final  Comment:        The GeneXpert MRSA Assay (FDA approved for NASAL specimens only), is one component of a comprehensive MRSA colonization surveillance program. It is not intended to diagnose MRSA infection nor to guide or monitor treatment for MRSA infections.     Radiology Reports Dg Chest Port 1 View  07/23/2015   CLINICAL DATA:  Cough.  Shortness of breath.  Fever.  EXAM: PORTABLE CHEST - 1 VIEW  COMPARISON:  01/22/2007  FINDINGS: Tapering of the peripheral pulmonary vasculature favors emphysema. Atherosclerotic calcification of the aortic arch is present.  Heart size within normal limits. Mild symmetric interstitial accentuation of both lung bases.  IMPRESSION: 1. Emphysema. 2. Faint interstitial accentuation of both lung bases is technically nonspecific but could be a manifestation of drug reaction, subtle edema, or atypical infectious process.   Electronically Signed   By: Gaylyn Rong M.D.   On: 07/23/2015 17:57     CBC  Recent Labs Lab 07/23/15 1710 07/23/15 2230 07/24/15 0356 07/25/15 0607 07/26/15 0528  WBC 22.5* 14.3* 21.4* 15.5* 12.8*  HGB 13.7 12.7 13.2 10.8* 11.0*  HCT 42.3 39.8 40.9 34.4* 35.4*  PLT 232 221 223 197 204  MCV 104.2* 103.6* 104.9* 105.5* 106.3*  MCH 33.7 33.1 33.8 33.1 33.0  MCHC 32.4 31.9 32.3 31.4 31.1  RDW 13.5 13.6 13.7 14.0 14.2  LYMPHSABS 1.1  --  0.9  --   --    MONOABS 2.5*  --  2.4*  --   --   EOSABS 0.0  --  0.0  --   --   BASOSABS 0.0  --  0.0  --   --     Chemistries   Recent Labs Lab 07/23/15 1710  07/24/15 0356 07/25/15 0607 07/26/15 0528 07/27/15 0511 07/28/15 0520  NA 135  --  141 147* 145 146* 147*  K 4.2  --  3.9 3.9 3.8 3.9 4.4  CL 106  --  113* 122* 117* 118* 115*  CO2 17*  --  22 22 22 22 23   GLUCOSE 202*  --  153* 112* 119* 110* 99  BUN 9  --  8 9 <5* 7 15  CREATININE 1.11*  < > 1.01* 0.87 0.88 0.78 0.94  CALCIUM 9.3  --  9.0 8.6* 8.6* 8.6* 9.1  AST 25  --  24  --   --   --   --   ALT 13*  --  15  --   --   --   --   ALKPHOS 79  --  57  --   --   --   --   BILITOT 0.5  --  0.5  --   --   --   --   < > = values in this interval not displayed. ------------------------------------------------------------------------------------------------------------------ estimated creatinine clearance is 45.6 mL/min (by C-G formula based on Cr of 0.94). ------------------------------------------------------------------------------------------------------------------ No results for input(s): HGBA1C in the last 72 hours. ------------------------------------------------------------------------------------------------------------------ No results for input(s): CHOL, HDL, LDLCALC, TRIG, CHOLHDL, LDLDIRECT in the last 72 hours. ------------------------------------------------------------------------------------------------------------------ No results for input(s): TSH, T4TOTAL, T3FREE, THYROIDAB in the last 72 hours.  Invalid input(s): FREET3 ------------------------------------------------------------------------------------------------------------------ No results for input(s): VITAMINB12, FOLATE, FERRITIN, TIBC, IRON, RETICCTPCT in the last 72 hours.  Coagulation profile No results for input(s): INR, PROTIME in the last 168 hours.  No results for input(s): DDIMER in the last 72 hours.  Cardiac Enzymes  Recent Labs Lab  07/23/15 2230 07/24/15 0356 07/24/15 1010  TROPONINI <0.03 <  0.03 <0.03   ------------------------------------------------------------------------------------------------------------------ Invalid input(s): POCBNP     Time Spent in minutes   30 minutes   ELGERGAWY, DAWOOD M.D on 07/28/2015 at 3:34 PM  Between 7am to 7pm - Pager - [phone]  After 7pm go to [email or web address] - password [facility]  [facility]   Office  [phone]

## 2015-07-29 LAB — BASIC METABOLIC PANEL
ANION GAP: 7 (ref 5–15)
BUN: 14 mg/dL (ref 6–20)
CO2: 25 mmol/L (ref 22–32)
Calcium: 9.1 mg/dL (ref 8.9–10.3)
Chloride: 114 mmol/L — ABNORMAL HIGH (ref 101–111)
Creatinine, Ser: 0.85 mg/dL (ref 0.44–1.00)
GFR calc Af Amer: >60 mL/min (ref >60)
GLUCOSE: 122 mg/dL — AB (ref 65–99)
POTASSIUM: 4 mmol/L (ref 3.5–5.1)
Sodium: 146 mmol/L — ABNORMAL HIGH (ref 135–145)

## 2015-07-29 MED ORDER — AMOXICILLIN-POT CLAVULANATE 875-125 MG PO TABS: 1.0000 | ORAL_TABLET | Freq: Two times a day (BID) | ORAL | Status: DC

## 2015-07-29 MED ORDER — VALPROIC ACID 250 MG PO CAPS: 500.0000 mg | ORAL_CAPSULE | Freq: Two times a day (BID) | ORAL | Status: AC

## 2015-07-29 NOTE — Progress Notes (Signed)
Utilization Review completed. Pallas Wahlert RN BSN CM 

## 2015-07-29 NOTE — Care Management Note (Signed)
Case Management Note  Patient Details  Name: ROSHAN SALAMON MRN: [id number] Date of Birth: 1951-11-17  Subjective/Objective:                 DC to ALF [facility].   Action/Plan:   Expected Discharge Date:                  Expected Discharge Plan:  Assisted Living / Rest Home  In-House Referral:  Clinical Social Work  Discharge planning Services  CM Consult  Post Acute Care Choice:    Choice offered to:  Patient  DME Arranged:    DME Agency:     HH Arranged:    HH Agency:     Status of Service:  Completed, signed off  Medicare Important Message Given:  Yes-second notification given Date Medicare IM Given:    Medicare IM give by:    Date Additional Medicare IM Given:    Additional Medicare Important Message give by:     If discussed at Long Length of Stay Meetings, dates discussed:    Additional Comments:  Lawerance Sabal, RN 07/29/2015, 11:23 AM

## 2015-07-29 NOTE — Clinical Social Work Note (Signed)
Clinical Social Work Assessment  Patient Details  Name: Mary Avila MRN: [id number] Date of Birth: 1951/02/13  Date of referral:  07/29/15               Reason for consult:  Discharge Planning                Permission sought to share information with:  Facility Sport and exercise psychologist, Guardian Permission granted to share information::  Yes, Verbal Permission Granted  Name::     Mary Avila  Agency::  [facility]  Relationship::     Contact Information:     Housing/Transportation Living arrangements for the past 2 months:  [place] of Information:  Patient, Guardian Patient Interpreter Needed:  None Criminal Activity/Legal Involvement Pertinent to Current Situation/Hospitalization:  No - Comment as needed Significant Relationships:  Other(Comment) (Guardian and facility residents) Lives with:  Facility Resident Do you feel safe going back to the place where you live?  Yes Need for family participation in patient care:  Yes (Comment)  Care giving concerns:  Patient states she has no concerns at this time. Guardian [name] has no concerns either.   Social Worker assessment / plan:  CSW met with patient at bedside to complete assessment. Patient states that she is ready to go back to [place] today. She reports feeling "cramped up" from being in the hospital so long. The patient states she enjoys [facility] and is happy at the facility. CSW has spoken with Mary Avila, the patient's guardian who states that she agrees with plan for the patient to return to [facility] today. Per patient she plans to ride the bus to [facility]. The facility and the patient's guardian [name] all agree that the patient can do this. CSW explained to the patient that she will need to take her discharge packet directly to the facility. RN has been updated on situation. RN to give the patient the DC packet once patient can DC.  Employment status:  Disabled  (Comment on whether or not currently receiving Disability) Insurance information:  [employer] PT Recommendations:  No Follow Up Information / Referral to community resources:  Other (Comment Required) (Information sent to [facility])  Patient/Family's Response to care:  Patient express gratitude for the care she has received. She states "everyone has been great, you saved my life."  Patient/Family's Understanding of and Emotional Response to Diagnosis, Current Treatment, and Prognosis:  Patient appears to have fair insight into reason for admission and understands her post DC needs.  Emotional Assessment Appearance:  Appears stated age Attitude/Demeanor/Rapport:  Other (Appropriate with CSW) Affect (typically observed):  Accepting, Pleasant, Appropriate Orientation:  Oriented to Self, Oriented to Place, Oriented to  Time, Oriented to Situation Alcohol / Substance use:  Tobacco Use, Alcohol Use Psych involvement (Current and /or in the community):  No (Comment)  Discharge Needs  Concerns to be addressed:    Readmission within the last 30 days:  No Current discharge risk:  Psychiatric Illness Barriers to Discharge:  No Barriers Identified   Liz Beach MSW, [place], [place], [phone]

## 2015-07-29 NOTE — Discharge Summary (Addendum)
Mary Avila, is a 64 y.o. female  DOB 1951/10/16  MRN [id number].  Admission date:  07/23/2015  Admitting Physician  Eduard Clos, MD  Discharge Date:  07/29/2015   Primary MD  No primary care provider on file.  Recommendations for primary care physician for things to follow:   check cbc, BMP in 48 hours at facility to monitor sodium level. As she was diagnosed with nephrogenic diabetes insipidus - Patient to be seen by ALF facility physician in 48 hours to address her volume status, and to be sure she is appropriately hydrated. - Patient to follow with [facility], her lithium has been stopped, started on Depakote and stated she reports her appointment is on 8/24   Admission Diagnosis  Sepsis [A41.9]   Discharge Diagnosis  Sepsis [A41.9]    Principal Problem:   Sepsis Active Problems:   Pneumonia   COPD (chronic obstructive pulmonary disease)   Bipolar disorder   Nephrogenic diabetes insipidus      Past Medical History  Diagnosis Date  . Schizoaffective disorder   . Personality disorder   . Osteopenia   . Bipolar disorder   . Weakness   . COPD (chronic obstructive pulmonary disease)     Past Surgical History  Procedure Laterality Date  . Right wrist surgery         History of present illness and  Hospital Course:     Kindly see H&P for history of present illness and admission details, please review complete Labs, Consult reports and Test reports for all details in brief  HPI  from the history and physical done on the day of admission Mary Avila is a 64 y.o. female with history of COPD, bipolar disorder and ongoing tobacco abuse was brought from the assisted living facility after patient was found to have increasing chest congestion shortness of breath and productive cough with fever and chills. Patient has been having increasing throat congestion and  above-mentioned symptoms for last few days. In the ER patient was found to be tachycardic mildly hypotensive and lactic acid was found to be elevated and patient was febrile. Chest x-ray shows possible infiltrates. On exam patient is wheezing bilaterally. Patient was given fluid bolus for sepsis and antibiotics were started for healthcare associated pneumonia. On exam patient states he feels much better than when she came. Has mild nausea denies any abdominal pain diarrhea   Hospital Course   Sepsis secondary to pneumonia - Treated with IV vancomycin and Zosyn for healthcare associated pneumonia, given negative blood cultures transitioned to oral Augmentin, to finish another day as an outpatient - Legionella , HIV and strep P antigen are negative - blood cultures NGTD  COPD - No active wheezing, continue with nebs, continue with Advair  Acute renal failure - due to volume depletion and infection,resolved  Hypernatremia  - In the setting of nephrogenic diabetes insipidus.  Nephrogenic Diabetes insipidus  - Patient with significant urine output, has low urine osmolality, and elevated sodium osmolality, picture compatible with  nephrogenic diabetes insipidus. - Nephrology consult appreciated - Lithium has been stopped as discussed with psychiatric - Patient with significant urinary output, but appears to be trending down after stopping lithium 10L >>8L>> 6L>>4L , patient encouraged to have plenty of fluid intake.  Bipolar disorder - stopped patient lithium giving her nephrogenic diabetes insipidus, started her on Depakote 500 mg twice a day instead as discussed with inpatient psychiatry Dr. Shela Commons, will continue with olanzapine . - Reports she has a follow-up with [facility] psychiatrically on 8/24, instructed to keep that appointment.   Elevated blood glucose - No history of diabetes mellitus, glucose level are acceptable during hospital stay      Discharge Condition:   stable   Follow UP  Follow-up Information    Follow up with [facility].   Specialty:  Behavioral Health   Why:  Keep your appointment on 8/24   Contact information:   [address] [place] [address] [phone]       Follow up with [facility].   Specialty:  [facility] T[facility] information:   [address] [place] [address] [phone]         Discharge Instructions  and  Discharge Medications    Discharge Instructions    Discharge instructions    Complete by:  As directed   Follow with Primary MD  in 7 days  - Patient is encouraged to drink large amount of fluids to keep with her urinary output secondary to diabetes insipidus  Get CBC, CMP, 2 view Chest X ray checked  by Primary MD next visit.    Activity: As tolerated with Full fall precautions use walker/cane & assistance as needed   Disposition ALF   Diet: Heart Healthy  , with feeding assistance and aspiration precautions.    On your next visit with your primary care physician please Get Medicines reviewed and adjusted.   Please request your Prim.MD to go over all Hospital Tests and Procedure/Radiological results at the follow up, please get all Hospital records sent to your Prim MD by signing hospital release before you go home.   If you experience worsening of your admission symptoms, develop shortness of breath, life threatening emergency, suicidal or homicidal thoughts you must seek medical attention immediately by calling 911 or calling your MD immediately  if symptoms less severe.  You Must read complete instructions/literature along with all the possible adverse reactions/side effects for all the Medicines you take and that have been prescribed to you. Take any new Medicines after you have completely understood and accpet all the possible adverse reactions/side effects.   Do not drive, operating heavy machinery, perform activities at heights, swimming or participation in water  activities or provide baby sitting services if your were admitted for syncope or siezures until you have seen by Primary MD or a Neurologist and advised to do so again.  Do not drive when taking Pain medications.    Do not take more than prescribed Pain, Sleep and Anxiety Medications  Special Instructions: If you have smoked or chewed Tobacco  in the last 2 yrs please stop smoking, stop any regular Alcohol  and or any Recreational drug use.  Wear Seat belts while driving.   Please note  You were cared for by a hospitalist during your hospital stay. If you have any questions about your discharge medications or the care you received while you were in the hospital after you are discharged, you can call the unit and asked to  speak with the hospitalist on call if the hospitalist that took care of you is not available. Once you are discharged, your primary care physician will handle any further medical issues. Please note that NO REFILLS for any discharge medications will be authorized once you are discharged, as it is imperative that you return to your primary care physician (or establish a relationship with a primary care physician if you do not have one) for your aftercare needs so that they can reassess your need for medications and monitor your lab values.     Increase activity slowly    Complete by:  As directed             Medication List    STOP taking these medications        lithium carbonate 300 MG CR tablet  Commonly known as:  LITHOBID      TAKE these medications        albuterol 108 (90 BASE) MCG/ACT inhaler  Commonly known as:  PROVENTIL HFA;VENTOLIN HFA  Inhale 2 puffs into the lungs every 4 (four) hours as needed for wheezing or shortness of breath.     amoxicillin-clavulanate 875-125 MG per tablet  Commonly known as:  AUGMENTIN  Take 1 tablet by mouth every 12 (twelve) hours.     feeding supplement (ENSURE ENLIVE) Liqd  Take 237 mLs by mouth 2 (two) times daily  between meals.     Fluticasone-Salmeterol 250-50 MCG/DOSE Aepb  Commonly known as:  ADVAIR  Inhale 1 puff into the lungs 2 (two) times daily. Rinse mouth after use.     guaiFENesin 600 MG 12 hr tablet  Commonly known as:  MUCINEX  Take 600 mg by mouth 2 (two) times daily.     OLANZapine 10 MG tablet  Commonly known as:  ZYPREXA  Take 5-15 mg by mouth at bedtime. 5 mg every morning and 15 mg every evening.     topiramate 25 MG tablet  Commonly known as:  TOPAMAX  Take 25-50 mg by mouth 2 (two) times daily. 25 mg every morning and 50 mg every evening.     valproic acid 250 MG capsule  Commonly known as:  DEPAKENE  Take 2 capsules (500 mg total) by mouth 2 (two) times daily.          Diet and Activity recommendation: See Discharge Instructions above   Consults obtained -  renal   Major procedures and Radiology Reports - PLEASE review detailed and final reports for all details, in brief -      Dg Chest Port 1 View  07/23/2015   CLINICAL DATA:  Cough.  Shortness of breath.  Fever.  EXAM: PORTABLE CHEST - 1 VIEW  COMPARISON:  01/22/2007  FINDINGS: Tapering of the peripheral pulmonary vasculature favors emphysema. Atherosclerotic calcification of the aortic arch is present.  Heart size within normal limits. Mild symmetric interstitial accentuation of both lung bases.  IMPRESSION: 1. Emphysema. 2. Faint interstitial accentuation of both lung bases is technically nonspecific but could be a manifestation of drug reaction, subtle edema, or atypical infectious process.   Electronically Signed   By: Gaylyn Rong M.D.   On: 07/23/2015 17:57    Micro Results     Recent Results (from the past 240 hour(s))  Culture, blood (routine x 2)     Status: None   Collection Time: 07/23/15  5:10 PM  Result Value Ref Range Status   Specimen Description BLOOD LEFT ARM  Final   Special Requests  BOTTLES DRAWN AEROBIC AND ANAEROBIC  Final   Culture NO GROWTH 5 DAYS  Final   Report  Status 07/28/2015 FINAL  Final  Culture, blood (routine x 2)     Status: None   Collection Time: 07/23/15  5:35 PM  Result Value Ref Range Status   Specimen Description BLOOD RIGHT ARM  Final   Special Requests BOTTLES DRAWN AEROBIC AND ANAEROBIC 5CC  Final   Culture NO GROWTH 5 DAYS  Final   Report Status 07/28/2015 FINAL  Final  Urine culture     Status: None   Collection Time: 07/23/15  7:40 PM  Result Value Ref Range Status   Specimen Description URINE, RANDOM  Final   Special Requests NONE  Final   Culture NO GROWTH 2 DAYS  Final   Report Status 07/25/2015 FINAL  Final  MRSA PCR Screening     Status: None   Collection Time: 07/24/15  6:02 AM  Result Value Ref Range Status   MRSA by PCR NEGATIVE NEGATIVE Final    Comment:        The GeneXpert MRSA Assay (FDA approved for NASAL specimens only), is one component of a comprehensive MRSA colonization surveillance program. It is not intended to diagnose MRSA infection nor to guide or monitor treatment for MRSA infections.        Today   Subjective:   Mary Avila today has no headache,no chest abdominal pain,no new weakness tingling or numbness, feels much better wants to go home today.   Objective:   Blood pressure 122/80, pulse 79, temperature 98.1 F (36.7 C), temperature source Oral, resp. rate 18, height 5' 2.75" (1.594 m), weight 47.174 kg (104 lb), SpO2 98 %.   Intake/Output Summary (Last 24 hours) at 07/29/15 1013 Last data filed at 07/29/15 0944  Gross per 24 hour  Intake   3160 ml  Output   3750 ml  Net   -590 ml    Exam Awake Alert, Oriented x 3, No new F.N deficits, Normal affect [place].AT,PERRAL, moist oral mucosa. Supple Neck,No JVD, No cervical lymphadenopathy appriciated.  Symmetrical Chest wall movement, Good air movement bilaterally, no wheezing RRR,No Gallops,Rubs or new Murmurs, No Parasternal Heave +ve B.Sounds, Abd Soft, Non tender, No organomegaly appriciated, No rebound -guarding or  rigidity. No Cyanosis, Clubbing or edema, No new Rash or bruise  Data Review   CBC w Diff: Lab Results  Component Value Date   WBC 12.8* 07/26/2015   HGB 11.0* 07/26/2015   HCT 35.4* 07/26/2015   PLT 204 07/26/2015   LYMPHOPCT 4* 07/24/2015   MONOPCT 11 07/24/2015   EOSPCT 0 07/24/2015   BASOPCT 0 07/24/2015    CMP: Lab Results  Component Value Date   NA 146* 07/29/2015   K 4.0 07/29/2015   CL 114* 07/29/2015   CO2 25 07/29/2015   BUN 14 07/29/2015   CREATININE 0.85 07/29/2015   PROT 5.9* 07/24/2015   ALBUMIN 3.0* 07/24/2015   BILITOT 0.5 07/24/2015   ALKPHOS 57 07/24/2015   AST 24 07/24/2015   ALT 15 07/24/2015  .   Total Time in preparing paper work, data evaluation and todays exam - 35 minutes  Vu Liebman M.D on 07/29/2015 at 10:13 AM  [facility]   Office  [phone]     Please fax to [facility], Dr Annia Friendly, Jenelle Mages  Fax [phone]

## 2015-07-29 NOTE — Clinical Social Work Note (Signed)
Per MD patient ready to DC back to [facility]. RN, patient/family (Guardian Artist Pais), and facility notified of patient's DC. RN given number for report. DC packet on patient's chart to be given to patient by RN when patient can DC. CSW signing off at this time.   Roddie Mc MSW, [place], [place], [phone]

## 2015-07-29 NOTE — Progress Notes (Signed)
PIV's removed.  Discharge packet given to pt.  Pt to be escorted to bus station with RN.

## 2015-07-29 NOTE — Progress Notes (Signed)
Physical Therapy Treatment Patient Details Name: Mary Avila MRN: [id number] DOB: 1951-04-25 Today's Date: 07/29/2015    History of Present Illness Patient is a 64 y/o female who presents with COPD, bipolar disorder, schizoaffective disorder and ongoing tobacco abuse was brought from the ALF due to increasing chest congestion, SOB and productive cough with fever and chills. Chest x-ray shows possible infiltrates.     PT Comments    Patient independent with mobility. Some SOB but states she feels much better. Has assistance at DC if she needs it. Will signoff on acute PT  Follow Up Recommendations  No PT follow up;Supervision - Intermittent     Equipment Recommendations  None recommended by PT    Recommendations for Other Services       Precautions / Restrictions Precautions Precautions: Fall Precaution Comments: monitor 02    Mobility  Bed Mobility Overal bed mobility: Independent                Transfers Overall transfer level: Independent                  Ambulation/Gait Ambulation/Gait assistance: Modified independent (Device/Increase time) Ambulation Distance (Feet): 400 Feet             Stairs            Wheelchair Mobility    Modified Rankin (Stroke Patients Only)       Balance                                    Cognition Arousal/Alertness: Awake/alert Behavior During Therapy: WFL for tasks assessed/performed Overall Cognitive Status: Within Functional Limits for tasks assessed                      Exercises      General Comments        Pertinent Vitals/Pain Pain Assessment: No/denies pain    Home Living                      Prior Function            PT Goals (current goals can now be found in the care plan section) Progress towards PT goals: Goals met/education completed, patient discharged from PT    Frequency       PT Plan      Co-evaluation             End  of Session   Activity Tolerance: Patient tolerated treatment well Patient left: in bed;with call bell/phone within reach     Time: 1130-1144 PT Time Calculation (min) (ACUTE ONLY): 14 min  Charges:  $Gait Training: 8-22 mins                    G Codes:      Jacqualyn Posey 07/29/2015, 1:07 PM  07/29/2015 Jacqualyn Posey PTA [phone] pager [phone] office

## 2016-03-17 ENCOUNTER — Encounter (HOSPITAL_COMMUNITY): Payer: Self-pay | Admitting: Emergency Medicine

## 2016-03-17 ENCOUNTER — Emergency Department (HOSPITAL_COMMUNITY)
Admission: EM | Admit: 2016-03-17 | Discharge: 2016-03-17 | Disposition: A | Discharge status: Home / Self Care | Payer: Medicaid Other | Attending: Emergency Medicine | Admitting: Emergency Medicine

## 2016-03-17 ENCOUNTER — Emergency Department (HOSPITAL_COMMUNITY): Payer: Medicaid Other

## 2016-03-17 DIAGNOSIS — W06XXXA Fall from bed, initial encounter: Secondary | ICD-10-CM | POA: Insufficient documentation

## 2016-03-17 DIAGNOSIS — Z79899 Other long term (current) drug therapy: Secondary | ICD-10-CM | POA: Diagnosis not present

## 2016-03-17 DIAGNOSIS — Y998 Other external cause status: Secondary | ICD-10-CM | POA: Diagnosis not present

## 2016-03-17 DIAGNOSIS — S0003XA Contusion of scalp, initial encounter: Secondary | ICD-10-CM | POA: Insufficient documentation

## 2016-03-17 DIAGNOSIS — S0990XA Unspecified injury of head, initial encounter: Secondary | ICD-10-CM | POA: Diagnosis present

## 2016-03-17 DIAGNOSIS — F10120 Alcohol abuse with intoxication, uncomplicated: Secondary | ICD-10-CM | POA: Insufficient documentation

## 2016-03-17 DIAGNOSIS — J449 Chronic obstructive pulmonary disease, unspecified: Secondary | ICD-10-CM | POA: Diagnosis not present

## 2016-03-17 DIAGNOSIS — Y9289 Other specified places as the place of occurrence of the external cause: Secondary | ICD-10-CM | POA: Insufficient documentation

## 2016-03-17 DIAGNOSIS — Y9389 Activity, other specified: Secondary | ICD-10-CM | POA: Diagnosis not present

## 2016-03-17 DIAGNOSIS — F319 Bipolar disorder, unspecified: Secondary | ICD-10-CM | POA: Insufficient documentation

## 2016-03-17 DIAGNOSIS — R05 Cough: Secondary | ICD-10-CM

## 2016-03-17 DIAGNOSIS — F17210 Nicotine dependence, cigarettes, uncomplicated: Secondary | ICD-10-CM | POA: Insufficient documentation

## 2016-03-17 DIAGNOSIS — Z8739 Personal history of other diseases of the musculoskeletal system and connective tissue: Secondary | ICD-10-CM | POA: Diagnosis not present

## 2016-03-17 DIAGNOSIS — M542 Cervicalgia: Secondary | ICD-10-CM

## 2016-03-17 DIAGNOSIS — W19XXXA Unspecified fall, initial encounter: Secondary | ICD-10-CM

## 2016-03-17 DIAGNOSIS — F10920 Alcohol use, unspecified with intoxication, uncomplicated: Secondary | ICD-10-CM

## 2016-03-17 DIAGNOSIS — R059 Cough, unspecified: Secondary | ICD-10-CM

## 2016-03-17 DIAGNOSIS — S199XXA Unspecified injury of neck, initial encounter: Secondary | ICD-10-CM | POA: Diagnosis not present

## 2016-03-17 LAB — COMPREHENSIVE METABOLIC PANEL
ALT: 24 U/L (ref 14–54)
AST: 24 U/L (ref 15–41)
Albumin: 3.8 g/dL (ref 3.5–5.0)
Alkaline Phosphatase: 62 U/L (ref 38–126)
Anion gap: 10 (ref 5–15)
BUN: 13 mg/dL (ref 6–20)
CHLORIDE: 112 mmol/L — AB (ref 101–111)
CO2: 24 mmol/L (ref 22–32)
CREATININE: 1.09 mg/dL — AB (ref 0.44–1.00)
Calcium: 9 mg/dL (ref 8.9–10.3)
GFR, EST NON AFRICAN AMERICAN: 52 mL/min — AB (ref >60)
Glucose, Bld: 92 mg/dL (ref 65–99)
POTASSIUM: 3.9 mmol/L (ref 3.5–5.1)
SODIUM: 146 mmol/L — AB (ref 135–145)
Total Bilirubin: 0.1 mg/dL — ABNORMAL LOW (ref 0.3–1.2)
Total Protein: 6.5 g/dL (ref 6.5–8.1)

## 2016-03-17 LAB — CBC WITH DIFFERENTIAL/PLATELET
BASOS ABS: 0 10*3/uL (ref 0.0–0.1)
Basophils Relative: 0 %
EOS ABS: 0 10*3/uL (ref 0.0–0.7)
EOS PCT: 0 %
HCT: 41.8 % (ref 36.0–46.0)
Hemoglobin: 13.9 g/dL (ref 12.0–15.0)
Lymphocytes Relative: 37 %
Lymphs Abs: 2.7 10*3/uL (ref 0.7–4.0)
MCH: 33.2 pg (ref 26.0–34.0)
MCHC: 33.3 g/dL (ref 30.0–36.0)
MCV: 99.8 fL (ref 78.0–100.0)
MONO ABS: 0.4 10*3/uL (ref 0.1–1.0)
Monocytes Relative: 6 %
Neutro Abs: 4.1 10*3/uL (ref 1.7–7.7)
Neutrophils Relative %: 57 %
PLATELETS: 245 10*3/uL (ref 150–400)
RBC: 4.19 MIL/uL (ref 3.87–5.11)
RDW: 13.3 % (ref 11.5–15.5)
WBC: 7.3 10*3/uL (ref 4.0–10.5)

## 2016-03-17 LAB — ETHANOL: ALCOHOL ETHYL (B): 185 mg/dL — AB (ref <5)

## 2016-03-17 NOTE — ED Notes (Signed)
Patient going to restroom.

## 2016-03-17 NOTE — ED Notes (Signed)
Per EMS, patient states she fell out of bed but did not remember the event. Patient reports having "several swallows of vodka." Patient is alert, oriented x4. Per EMS, patient was not post-ictal upon their arrival. Hx of schizophrenia. Patient is from [facility].

## 2016-03-17 NOTE — ED Notes (Signed)
Patient transported to radiology

## 2016-03-17 NOTE — ED Notes (Signed)
[facility] notified of transport. 

## 2016-03-17 NOTE — ED Notes (Signed)
Report called to [facility]

## 2016-03-17 NOTE — ED Notes (Signed)
Discharge instructions and follow up care reviewed with patient and nursing home staff. Patient and nursing home staff verbalized understanding.
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
## 2018-04-19 ENCOUNTER — Other Ambulatory Visit (HOSPITAL_COMMUNITY): Payer: Self-pay | Admitting: Geriatric Medicine

## 2018-04-28 ENCOUNTER — Other Ambulatory Visit (HOSPITAL_COMMUNITY): Payer: Self-pay | Admitting: Geriatric Medicine

## 2018-04-28 DIAGNOSIS — T148XXA Other injury of unspecified body region, initial encounter: Secondary | ICD-10-CM

## 2018-05-05 ENCOUNTER — Ambulatory Visit (HOSPITAL_COMMUNITY)
Admission: RE | Admit: 2018-05-05 | Discharge: 2018-05-05 | Disposition: A | Discharge status: Home / Self Care | Payer: Medicare Other | Source: Ambulatory Visit | Attending: Geriatric Medicine | Admitting: Geriatric Medicine

## 2018-05-05 DIAGNOSIS — S32592A Other specified fracture of left pubis, initial encounter for closed fracture: Secondary | ICD-10-CM | POA: Diagnosis not present

## 2018-05-05 DIAGNOSIS — X58XXXA Exposure to other specified factors, initial encounter: Secondary | ICD-10-CM | POA: Diagnosis not present

## 2018-05-05 DIAGNOSIS — M7981 Nontraumatic hematoma of soft tissue: Secondary | ICD-10-CM | POA: Insufficient documentation

## 2018-05-05 DIAGNOSIS — M4186 Other forms of scoliosis, lumbar region: Secondary | ICD-10-CM | POA: Insufficient documentation

## 2018-05-05 DIAGNOSIS — M5136 Other intervertebral disc degeneration, lumbar region: Secondary | ICD-10-CM | POA: Insufficient documentation

## 2018-05-05 DIAGNOSIS — M25552 Pain in left hip: Secondary | ICD-10-CM | POA: Diagnosis present

## 2018-05-05 DIAGNOSIS — T148XXA Other injury of unspecified body region, initial encounter: Secondary | ICD-10-CM

## 2018-10-26 ENCOUNTER — Other Ambulatory Visit: Payer: Self-pay

## 2018-10-26 ENCOUNTER — Encounter (HOSPITAL_COMMUNITY): Payer: Self-pay

## 2018-10-26 ENCOUNTER — Emergency Department (HOSPITAL_COMMUNITY)
Admission: EM | Admit: 2018-10-26 | Discharge: 2018-10-26 | Disposition: A | Discharge status: Home / Self Care | Payer: Medicare Other | Attending: Emergency Medicine | Admitting: Emergency Medicine

## 2018-10-26 ENCOUNTER — Emergency Department (HOSPITAL_COMMUNITY): Payer: Medicare Other

## 2018-10-26 DIAGNOSIS — W010XXA Fall on same level from slipping, tripping and stumbling without subsequent striking against object, initial encounter: Secondary | ICD-10-CM | POA: Insufficient documentation

## 2018-10-26 DIAGNOSIS — F17210 Nicotine dependence, cigarettes, uncomplicated: Secondary | ICD-10-CM | POA: Insufficient documentation

## 2018-10-26 DIAGNOSIS — F319 Bipolar disorder, unspecified: Secondary | ICD-10-CM | POA: Diagnosis not present

## 2018-10-26 DIAGNOSIS — S6992XA Unspecified injury of left wrist, hand and finger(s), initial encounter: Secondary | ICD-10-CM | POA: Diagnosis present

## 2018-10-26 DIAGNOSIS — Y9301 Activity, walking, marching and hiking: Secondary | ICD-10-CM | POA: Insufficient documentation

## 2018-10-26 DIAGNOSIS — Y998 Other external cause status: Secondary | ICD-10-CM | POA: Diagnosis not present

## 2018-10-26 DIAGNOSIS — J449 Chronic obstructive pulmonary disease, unspecified: Secondary | ICD-10-CM | POA: Insufficient documentation

## 2018-10-26 DIAGNOSIS — S52122A Displaced fracture of head of left radius, initial encounter for closed fracture: Secondary | ICD-10-CM | POA: Diagnosis not present

## 2018-10-26 DIAGNOSIS — Z79899 Other long term (current) drug therapy: Secondary | ICD-10-CM | POA: Diagnosis not present

## 2018-10-26 DIAGNOSIS — Y9289 Other specified places as the place of occurrence of the external cause: Secondary | ICD-10-CM | POA: Diagnosis not present

## 2018-10-26 MED ORDER — TRAMADOL HCL 50 MG PO TABS: 50.0000 mg | ORAL_TABLET | Freq: Three times a day (TID) | ORAL | 0 refills | Status: AC | PRN

## 2018-10-26 MED ORDER — ACETAMINOPHEN 325 MG PO TABS
650.0000 mg | ORAL_TABLET | Freq: Once | ORAL | Status: AC
  Administered 2018-10-26: 650 mg via ORAL
  Filled 2018-10-26: qty 2

## 2018-10-26 MED ORDER — TRAMADOL HCL 50 MG PO TABS
50.0000 mg | ORAL_TABLET | Freq: Once | ORAL | Status: AC
  Administered 2018-10-26: 50 mg via ORAL
  Filled 2018-10-26: qty 1

## 2018-10-26 NOTE — ED Notes (Signed)
Signature pad in room not working.  Pt agreed to verbal consent for D/C signature

## 2018-10-26 NOTE — ED Provider Notes (Signed)
[facility]-EMERGENCY DEPT Provider Note   CSN: [id number] Arrival date & time: 10/26/18  1122     History   Chief Complaint Chief Complaint  Patient presents with  . Arm Injury    HPI Mary Avila is a 67 y.o. female.  HPI Patient states that roughly 1 month ago she tripped and fell landing on her left outstretched hand.  She complains of left wrist and left elbow pain since that time.  Worse with movement and pronation.  Denies any head injury.  No neck pain.  No definite weakness or numbness.    Past Medical History:  Diagnosis Date  . Bipolar disorder (HCC)   . COPD (chronic obstructive pulmonary disease) (HCC)   . Osteopenia   . Personality disorder (HCC)   . Schizoaffective disorder   . Weakness     Patient Active Problem List   Diagnosis Date Noted  . 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 Pneumonia 07/23/2015  . Sepsis (HCC) 07/23/2015  . COPD (chronic obstructive pulmonary disease) (HCC) 07/23/2015  . Bipolar disorder (HCC) 07/23/2015    Past Surgical History:  Procedure Laterality Date  . right wrist surgery       OB History   None      Home Medications    Prior to Admission medications   Medication Sig Start Date End Date Taking? Authorizing Provider  albuterol (PROVENTIL HFA;VENTOLIN HFA) 108 (90 BASE) MCG/ACT inhaler Inhale 2 puffs into the lungs every 4 (four) hours as needed for wheezing or shortness of breath.   Yes [provider]  benztropine (COGENTIN) 0.5 MG tablet Take 0.5 mg by mouth daily. 10/24/18  Yes [provider]  Fluticasone-Salmeterol (ADVAIR) 250-50 MCG/DOSE AEPB Inhale 1 puff into the lungs 2 (two) times daily. Rinse mouth after use.   Yes [provider]  LORazepam (ATIVAN) 0.5 MG tablet Take 0.5 mg by mouth daily. 10/10/18  Yes [provider]  OLANZapine (ZYPREXA) 15 MG tablet Take 15 mg by mouth at bedtime. 5 mg every morning and 15 mg every  evening.   Yes [provider]  tiotropium (SPIRIVA) 18 MCG inhalation capsule Place 18 mcg into inhaler and inhale daily.   Yes [provider]  topiramate (TOPAMAX) 25 MG tablet Take 25-50 mg by mouth 2 (two) times daily. 25 mg every morning and 50 mg every evening.   Yes [provider]  valproic acid (DEPAKENE) 250 MG capsule Take 2 capsules (500 mg total) by mouth 2 (two) times daily. 07/29/15  Yes Elgergawy, Leana Roe, MD  traMADol (ULTRAM) 50 MG tablet Take 1 tablet (50 mg total) by mouth 3 (three) times daily as needed. 10/26/18   Loren Racer, MD    Family History Family History  Problem Relation Age of Onset  . COPD Father     Social History Social History   Tobacco Use  . Smoking status: Current Every Day Smoker    Packs/day: 2.50    Types: Cigarettes  . Smokeless tobacco: Never Used  Substance Use Topics  . Alcohol use: Yes    Comment: once a month  . Drug use: No     Allergies   Patient has no known allergies.   Review of Systems Review of Systems  Constitutional: Negative for chills and fever.  Musculoskeletal: Positive for arthralgias. Negative for back pain and neck pain.  Skin: Negative for rash and wound.  Neurological: Negative for weakness, numbness and headaches.  All other systems reviewed  and are negative.    Physical Exam Updated Vital Signs BP (!) 119/99   Pulse 88   Temp 98.4 F (36.9 C) (Oral)   Resp 20   Ht 5\' 6"  (1.676 m)   Wt 40.8 kg   SpO2 98%   BMI 14.53 kg/m   Physical Exam  Constitutional: She is oriented to person, place, and time. She appears well-developed and well-nourished. No distress.  HENT:  Head: Normocephalic and atraumatic.  Eyes: Pupils are equal, round, and reactive to light. EOM are normal.  Neck: Normal range of motion. Neck supple.  Posterior midline cervical tenderness to palpation.  Cardiovascular: Normal rate and regular rhythm.  Pulmonary/Chest: Effort normal.  Abdominal:  Soft. Bowel sounds are normal.  Musculoskeletal: Normal range of motion. She exhibits tenderness. She exhibits no edema.  Dorsal tenderness to the left wrist without obvious deformity.  No definite snuffbox tenderness.  Patient has full range of motion of the wrist and elbow.  She has some mild tenderness on BiPAP to palpation of the left elbow.  Full range of motion of the left shoulder without deformity, pain or effusion.  Distal pulses are 2+.  Neurological: She is alert and oriented to person, place, and time.  No focal weakness or numbness to light touch.  Skin: Skin is warm and dry. Capillary refill takes less than 2 seconds. No rash noted. She is not diaphoretic. No erythema.  Psychiatric: She has a normal mood and affect. Her behavior is normal.  Nursing note and vitals reviewed.    ED Treatments / Results  Labs (all labs ordered are listed, but only abnormal results are displayed) Labs Reviewed - No data to display  EKG None  Radiology Dg Elbow Complete Left  Result Date: 10/26/2018 CLINICAL DATA:  Fall, landed on left side.  Left arm pain. EXAM: LEFT ELBOW - COMPLETE 3+ VIEW COMPARISON:  None. FINDINGS: There is a comminuted fracture noted through the left radial neck. Mild displacement. No subluxation or dislocation. Suspect a left elbow joint effusion. IMPRESSION: Comminuted, displaced left radial neck fracture. Electronically Signed   By: Charlett NoseKevin  Dover M.D.   On: 10/26/2018 12:18   Dg Wrist Complete Left  Result Date: 10/26/2018 CLINICAL DATA:  Fall EXAM: LEFT WRIST - COMPLETE 3+ VIEW COMPARISON:  None. FINDINGS: Mild degenerative changes at the 1st carpometacarpal joint. No acute bony abnormality. Specifically, no fracture, subluxation, or dislocation. IMPRESSION: No acute bony abnormality. Electronically Signed   By: Charlett NoseKevin  Dover M.D.   On: 10/26/2018 12:17    Procedures Procedures (including critical care time)  Medications Ordered in ED Medications  traMADol  (ULTRAM) tablet 50 mg (50 mg Oral Given 10/26/18 1246)  acetaminophen (TYLENOL) tablet 650 mg (650 mg Oral Given 10/26/18 1246)     Initial Impression / Assessment and Plan / ED Course  I have reviewed the triage vital signs and the nursing notes.  Pertinent labs & imaging results that were available during my care of the patient were reviewed by me and considered in my medical decision making (see chart for details).     Displaced radial head fracture on x-ray.  Placed in splint and sling.  Will have follow-up with orthopedics.  Return precautions given.  Final Clinical Impressions(s) / ED Diagnoses   Final diagnoses:  Closed displaced fracture of head of left radius, initial encounter    ED Discharge Orders         Ordered    traMADol (ULTRAM) 50 MG tablet  3 times  daily PRN     10/26/18 1333           Loren Racer, MD 10/26/18 1334

## 2018-10-26 NOTE — ED Notes (Signed)
Ortho called for splint appliacation

## 2018-10-26 NOTE — ED Notes (Signed)
[facility] called for transport at D/C
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
## 2021-12-15 ENCOUNTER — Emergency Department (HOSPITAL_COMMUNITY): Payer: Medicare Other

## 2021-12-15 ENCOUNTER — Other Ambulatory Visit: Payer: Self-pay

## 2021-12-15 ENCOUNTER — Encounter (HOSPITAL_COMMUNITY): Payer: Self-pay

## 2021-12-15 ENCOUNTER — Inpatient Hospital Stay (HOSPITAL_COMMUNITY): Payer: Medicare Other

## 2021-12-15 ENCOUNTER — Inpatient Hospital Stay (HOSPITAL_COMMUNITY)
Admission: EM | Admit: 2021-12-15 | Discharge: 2022-01-07 | DRG: 870 | Disposition: E | Payer: Medicare Other | Source: Skilled Nursing Facility | Attending: Pulmonary Disease | Admitting: Pulmonary Disease

## 2021-12-15 DIAGNOSIS — Z978 Presence of other specified devices: Secondary | ICD-10-CM

## 2021-12-15 DIAGNOSIS — F259 Schizoaffective disorder, unspecified: Secondary | ICD-10-CM | POA: Diagnosis present

## 2021-12-15 DIAGNOSIS — R6521 Severe sepsis with septic shock: Secondary | ICD-10-CM | POA: Diagnosis present

## 2021-12-15 DIAGNOSIS — J9621 Acute and chronic respiratory failure with hypoxia: Secondary | ICD-10-CM

## 2021-12-15 DIAGNOSIS — E874 Mixed disorder of acid-base balance: Secondary | ICD-10-CM | POA: Diagnosis not present

## 2021-12-15 DIAGNOSIS — Z789 Other specified health status: Secondary | ICD-10-CM | POA: Diagnosis not present

## 2021-12-15 DIAGNOSIS — M8580 Other specified disorders of bone density and structure, unspecified site: Secondary | ICD-10-CM | POA: Diagnosis present

## 2021-12-15 DIAGNOSIS — E1165 Type 2 diabetes mellitus with hyperglycemia: Secondary | ICD-10-CM | POA: Diagnosis not present

## 2021-12-15 DIAGNOSIS — Z66 Do not resuscitate: Secondary | ICD-10-CM | POA: Diagnosis not present

## 2021-12-15 DIAGNOSIS — G9340 Encephalopathy, unspecified: Secondary | ICD-10-CM

## 2021-12-15 DIAGNOSIS — E43 Unspecified severe protein-calorie malnutrition: Secondary | ICD-10-CM | POA: Diagnosis present

## 2021-12-15 DIAGNOSIS — J984 Other disorders of lung: Secondary | ICD-10-CM

## 2021-12-15 DIAGNOSIS — F17210 Nicotine dependence, cigarettes, uncomplicated: Secondary | ICD-10-CM | POA: Diagnosis present

## 2021-12-15 DIAGNOSIS — J15211 Pneumonia due to Methicillin susceptible Staphylococcus aureus: Secondary | ICD-10-CM | POA: Diagnosis present

## 2021-12-15 DIAGNOSIS — J189 Pneumonia, unspecified organism: Secondary | ICD-10-CM | POA: Diagnosis not present

## 2021-12-15 DIAGNOSIS — A419 Sepsis, unspecified organism: Secondary | ICD-10-CM | POA: Diagnosis not present

## 2021-12-15 DIAGNOSIS — G928 Other toxic encephalopathy: Secondary | ICD-10-CM | POA: Diagnosis not present

## 2021-12-15 DIAGNOSIS — E8809 Other disorders of plasma-protein metabolism, not elsewhere classified: Secondary | ICD-10-CM | POA: Diagnosis present

## 2021-12-15 DIAGNOSIS — J432 Centrilobular emphysema: Secondary | ICD-10-CM | POA: Diagnosis present

## 2021-12-15 DIAGNOSIS — I469 Cardiac arrest, cause unspecified: Secondary | ICD-10-CM | POA: Diagnosis present

## 2021-12-15 DIAGNOSIS — T380X5A Adverse effect of glucocorticoids and synthetic analogues, initial encounter: Secondary | ICD-10-CM | POA: Diagnosis not present

## 2021-12-15 DIAGNOSIS — Z9911 Dependence on respirator [ventilator] status: Secondary | ICD-10-CM | POA: Diagnosis not present

## 2021-12-15 DIAGNOSIS — N179 Acute kidney failure, unspecified: Secondary | ICD-10-CM | POA: Diagnosis not present

## 2021-12-15 DIAGNOSIS — R053 Chronic cough: Secondary | ICD-10-CM | POA: Diagnosis not present

## 2021-12-15 DIAGNOSIS — Z7951 Long term (current) use of inhaled steroids: Secondary | ICD-10-CM

## 2021-12-15 DIAGNOSIS — Z515 Encounter for palliative care: Secondary | ICD-10-CM | POA: Diagnosis not present

## 2021-12-15 DIAGNOSIS — N170 Acute kidney failure with tubular necrosis: Secondary | ICD-10-CM | POA: Diagnosis present

## 2021-12-15 DIAGNOSIS — I5021 Acute systolic (congestive) heart failure: Secondary | ICD-10-CM | POA: Diagnosis present

## 2021-12-15 DIAGNOSIS — R778 Other specified abnormalities of plasma proteins: Secondary | ICD-10-CM | POA: Diagnosis not present

## 2021-12-15 DIAGNOSIS — T4275XA Adverse effect of unspecified antiepileptic and sedative-hypnotic drugs, initial encounter: Secondary | ICD-10-CM | POA: Diagnosis not present

## 2021-12-15 DIAGNOSIS — E875 Hyperkalemia: Secondary | ICD-10-CM | POA: Diagnosis not present

## 2021-12-15 DIAGNOSIS — Z681 Body mass index (BMI) 19 or less, adult: Secondary | ICD-10-CM

## 2021-12-15 DIAGNOSIS — Z79899 Other long term (current) drug therapy: Secondary | ICD-10-CM

## 2021-12-15 DIAGNOSIS — J9602 Acute respiratory failure with hypercapnia: Secondary | ICD-10-CM | POA: Diagnosis not present

## 2021-12-15 DIAGNOSIS — Z20822 Contact with and (suspected) exposure to covid-19: Secondary | ICD-10-CM | POA: Diagnosis present

## 2021-12-15 DIAGNOSIS — J851 Abscess of lung with pneumonia: Secondary | ICD-10-CM | POA: Diagnosis not present

## 2021-12-15 DIAGNOSIS — R570 Cardiogenic shock: Secondary | ICD-10-CM | POA: Diagnosis not present

## 2021-12-15 DIAGNOSIS — Z452 Encounter for adjustment and management of vascular access device: Secondary | ICD-10-CM

## 2021-12-15 DIAGNOSIS — J9600 Acute respiratory failure, unspecified whether with hypoxia or hypercapnia: Secondary | ICD-10-CM

## 2021-12-15 DIAGNOSIS — Z4659 Encounter for fitting and adjustment of other gastrointestinal appliance and device: Secondary | ICD-10-CM

## 2021-12-15 DIAGNOSIS — I4891 Unspecified atrial fibrillation: Secondary | ICD-10-CM | POA: Diagnosis not present

## 2021-12-15 DIAGNOSIS — D6489 Other specified anemias: Secondary | ICD-10-CM | POA: Diagnosis present

## 2021-12-15 DIAGNOSIS — A4101 Sepsis due to Methicillin susceptible Staphylococcus aureus: Secondary | ICD-10-CM | POA: Diagnosis not present

## 2021-12-15 DIAGNOSIS — R54 Age-related physical debility: Secondary | ICD-10-CM | POA: Diagnosis present

## 2021-12-15 DIAGNOSIS — J9601 Acute respiratory failure with hypoxia: Secondary | ICD-10-CM | POA: Diagnosis present

## 2021-12-15 DIAGNOSIS — E876 Hypokalemia: Secondary | ICD-10-CM | POA: Diagnosis not present

## 2021-12-15 DIAGNOSIS — R64 Cachexia: Secondary | ICD-10-CM | POA: Diagnosis present

## 2021-12-15 DIAGNOSIS — Z825 Family history of asthma and other chronic lower respiratory diseases: Secondary | ICD-10-CM

## 2021-12-15 LAB — URINALYSIS, ROUTINE W REFLEX MICROSCOPIC
Bilirubin Urine: NEGATIVE
Glucose, UA: NEGATIVE mg/dL
Ketones, ur: NEGATIVE mg/dL
Leukocytes,Ua: NEGATIVE
Nitrite: NEGATIVE
Protein, ur: NEGATIVE mg/dL
Specific Gravity, Urine: 1.01 (ref 1.005–1.030)
pH: 5.5 (ref 5.0–8.0)

## 2021-12-15 LAB — COMPREHENSIVE METABOLIC PANEL
ALT: 26 U/L (ref 0–44)
AST: 68 U/L — ABNORMAL HIGH (ref 15–41)
Albumin: 1.5 g/dL — ABNORMAL LOW (ref 3.5–5.0)
Alkaline Phosphatase: 79 U/L (ref 38–126)
Anion gap: 14 (ref 5–15)
BUN: 27 mg/dL — ABNORMAL HIGH (ref 8–23)
CO2: 19 mmol/L — ABNORMAL LOW (ref 22–32)
Calcium: 7.9 mg/dL — ABNORMAL LOW (ref 8.9–10.3)
Chloride: 109 mmol/L (ref 98–111)
Creatinine, Ser: 2.04 mg/dL — ABNORMAL HIGH (ref 0.44–1.00)
GFR, Estimated: 26 mL/min — ABNORMAL LOW (ref >60)
Glucose, Bld: 141 mg/dL — ABNORMAL HIGH (ref 70–99)
Potassium: 4.8 mmol/L (ref 3.5–5.1)
Sodium: 142 mmol/L (ref 135–145)
Total Bilirubin: 0.4 mg/dL (ref 0.3–1.2)
Total Protein: 5.9 g/dL — ABNORMAL LOW (ref 6.5–8.1)

## 2021-12-15 LAB — URINALYSIS, MICROSCOPIC (REFLEX)

## 2021-12-15 LAB — LACTIC ACID, PLASMA
Lactic Acid, Venous: 6.5 mmol/L (ref 0.5–1.9)
Lactic Acid, Venous: 1.5 mmol/L (ref 0.5–1.9)

## 2021-12-15 LAB — I-STAT ARTERIAL BLOOD GAS, ED
Acid-base deficit: 8 mmol/L — ABNORMAL HIGH (ref 0.0–2.0)
Acid-base deficit: 6 mmol/L — ABNORMAL HIGH (ref 0.0–2.0)
Bicarbonate: 23.1 mmol/L (ref 20.0–28.0)
Bicarbonate: 23.6 mmol/L (ref 20.0–28.0)
Calcium, Ion: 1.2 mmol/L (ref 1.15–1.40)
Calcium, Ion: 1.2 mmol/L (ref 1.15–1.40)
HCT: 25 % — ABNORMAL LOW (ref 36.0–46.0)
HCT: 27 % — ABNORMAL LOW (ref 36.0–46.0)
Hemoglobin: 8.5 g/dL — ABNORMAL LOW (ref 12.0–15.0)
Hemoglobin: 9.2 g/dL — ABNORMAL LOW (ref 12.0–15.0)
O2 Saturation: 95 %
O2 Saturation: 94 %
Patient temperature: 98.3
Patient temperature: 98.3
Potassium: 4.5 mmol/L (ref 3.5–5.1)
Potassium: 4.7 mmol/L (ref 3.5–5.1)
Sodium: 141 mmol/L (ref 135–145)
Sodium: 141 mmol/L (ref 135–145)
TCO2: 26 mmol/L (ref 22–32)
TCO2: 26 mmol/L (ref 22–32)
pCO2 arterial: 90.1 mmHg (ref 32.0–48.0)
pCO2 arterial: 70.5 mmHg (ref 32.0–48.0)
pH, Arterial: 7.015 — CL (ref 7.350–7.450)
pH, Arterial: 7.131 — CL (ref 7.350–7.450)
pO2, Arterial: 113 mmHg — ABNORMAL HIGH (ref 83.0–108.0)
pO2, Arterial: 94 mmHg (ref 83.0–108.0)

## 2021-12-15 LAB — PROTIME-INR
INR: 1.4 — ABNORMAL HIGH (ref 0.8–1.2)
Prothrombin Time: 17.3 seconds — ABNORMAL HIGH (ref 11.4–15.2)

## 2021-12-15 LAB — I-STAT VENOUS BLOOD GAS, ED
Acid-base deficit: 11 mmol/L — ABNORMAL HIGH (ref 0.0–2.0)
Bicarbonate: 19.8 mmol/L — ABNORMAL LOW (ref 20.0–28.0)
Calcium, Ion: 1.14 mmol/L — ABNORMAL LOW (ref 1.15–1.40)
HCT: 25 % — ABNORMAL LOW (ref 36.0–46.0)
Hemoglobin: 8.5 g/dL — ABNORMAL LOW (ref 12.0–15.0)
O2 Saturation: 98 %
Potassium: 4.7 mmol/L (ref 3.5–5.1)
Sodium: 142 mmol/L (ref 135–145)
TCO2: 22 mmol/L (ref 22–32)
pCO2, Ven: 76.8 mmHg (ref 44.0–60.0)
pH, Ven: 7.019 — CL (ref 7.250–7.430)
pO2, Ven: 149 mmHg — ABNORMAL HIGH (ref 32.0–45.0)

## 2021-12-15 LAB — POCT I-STAT 7, (LYTES, BLD GAS, ICA,H+H)
Acid-base deficit: 7 mmol/L — ABNORMAL HIGH (ref 0.0–2.0)
Bicarbonate: 22.8 mmol/L (ref 20.0–28.0)
Calcium, Ion: 1.22 mmol/L (ref 1.15–1.40)
HCT: 26 % — ABNORMAL LOW (ref 36.0–46.0)
Hemoglobin: 8.8 g/dL — ABNORMAL LOW (ref 12.0–15.0)
O2 Saturation: 79 %
Patient temperature: 38.1
Potassium: 5.4 mmol/L — ABNORMAL HIGH (ref 3.5–5.1)
Sodium: 141 mmol/L (ref 135–145)
TCO2: 25 mmol/L (ref 22–32)
pCO2 arterial: 79.8 mmHg (ref 32.0–48.0)
pH, Arterial: 7.071 — CL (ref 7.350–7.450)
pO2, Arterial: 66 mmHg — ABNORMAL LOW (ref 83.0–108.0)

## 2021-12-15 LAB — CBC WITH DIFFERENTIAL/PLATELET
Abs Immature Granulocytes: 0 10*3/uL (ref 0.00–0.07)
Basophils Absolute: 0 10*3/uL (ref 0.0–0.1)
Basophils Relative: 0 %
Eosinophils Absolute: 0 10*3/uL (ref 0.0–0.5)
Eosinophils Relative: 0 %
HCT: 27.1 % — ABNORMAL LOW (ref 36.0–46.0)
Hemoglobin: 7.8 g/dL — ABNORMAL LOW (ref 12.0–15.0)
Lymphocytes Relative: 4 %
Lymphs Abs: 1.1 10*3/uL (ref 0.7–4.0)
MCH: 31.8 pg (ref 26.0–34.0)
MCHC: 28.8 g/dL — ABNORMAL LOW (ref 30.0–36.0)
MCV: 110.6 fL — ABNORMAL HIGH (ref 80.0–100.0)
Monocytes Absolute: 1.1 10*3/uL — ABNORMAL HIGH (ref 0.1–1.0)
Monocytes Relative: 4 %
Neutro Abs: 25.4 10*3/uL — ABNORMAL HIGH (ref 1.7–7.7)
Neutrophils Relative %: 92 %
Platelets: 388 10*3/uL (ref 150–400)
RBC: 2.45 MIL/uL — ABNORMAL LOW (ref 3.87–5.11)
RDW: 14.6 % (ref 11.5–15.5)
WBC: 27.6 10*3/uL — ABNORMAL HIGH (ref 4.0–10.5)
nRBC: 0 % (ref 0.0–0.2)
nRBC: 1 /100 WBC — ABNORMAL HIGH

## 2021-12-15 LAB — TROPONIN I (HIGH SENSITIVITY)
Troponin I (High Sensitivity): 625 ng/L (ref <18)
Troponin I (High Sensitivity): 549 ng/L (ref <18)

## 2021-12-15 LAB — I-STAT CHEM 8, ED
BUN: 27 mg/dL — ABNORMAL HIGH (ref 8–23)
Calcium, Ion: 1.14 mmol/L — ABNORMAL LOW (ref 1.15–1.40)
Chloride: 110 mmol/L (ref 98–111)
Creatinine, Ser: 1.8 mg/dL — ABNORMAL HIGH (ref 0.44–1.00)
Glucose, Bld: 141 mg/dL — ABNORMAL HIGH (ref 70–99)
HCT: 26 % — ABNORMAL LOW (ref 36.0–46.0)
Hemoglobin: 8.8 g/dL — ABNORMAL LOW (ref 12.0–15.0)
Potassium: 4.7 mmol/L (ref 3.5–5.1)
Sodium: 142 mmol/L (ref 135–145)
TCO2: 22 mmol/L (ref 22–32)

## 2021-12-15 LAB — GLUCOSE, CAPILLARY: Glucose-Capillary: 139 mg/dL — ABNORMAL HIGH (ref 70–99)

## 2021-12-15 LAB — RESP PANEL BY RT-PCR (FLU A&B, COVID) ARPGX2
Influenza A by PCR: NEGATIVE
Influenza B by PCR: NEGATIVE
SARS Coronavirus 2 by RT PCR: NEGATIVE

## 2021-12-15 LAB — BRAIN NATRIURETIC PEPTIDE: B Natriuretic Peptide: 1129.5 pg/mL — ABNORMAL HIGH (ref 0.0–100.0)

## 2021-12-15 LAB — APTT: aPTT: 35 seconds (ref 24–36)

## 2021-12-15 MED ORDER — FENTANYL CITRATE PF 50 MCG/ML IJ SOSY
50.0000 ug | PREFILLED_SYRINGE | Freq: Once | INTRAMUSCULAR | Status: AC
  Administered 2021-12-15: 50 ug via INTRAVENOUS

## 2021-12-15 MED ORDER — DOCUSATE SODIUM 50 MG/5ML PO LIQD: 100.0000 mg | Freq: Two times a day (BID) | ORAL | Status: DC | PRN

## 2021-12-15 MED ORDER — LACTATED RINGERS IV BOLUS (SEPSIS)
1000.0000 mL | Freq: Once | INTRAVENOUS | Status: AC
  Administered 2021-12-15: 1000 mL via INTRAVENOUS

## 2021-12-15 MED ORDER — SODIUM CHLORIDE 0.9% FLUSH: 10.0000 mL | INTRAVENOUS | Status: DC | PRN

## 2021-12-15 MED ORDER — ORAL CARE MOUTH RINSE
15.0000 mL | OROMUCOSAL | Status: DC
  Administered 2021-12-15 – 2021-12-23 (×77): 15 mL via OROMUCOSAL

## 2021-12-15 MED ORDER — ROCURONIUM BROMIDE 50 MG/5ML IV SOLN
INTRAVENOUS | Status: AC | PRN
  Administered 2021-12-15: 80 mg via INTRAVENOUS

## 2021-12-15 MED ORDER — FENTANYL CITRATE PF 50 MCG/ML IJ SOSY
PREFILLED_SYRINGE | INTRAMUSCULAR | Status: AC
  Filled 2021-12-15: qty 1

## 2021-12-15 MED ORDER — VANCOMYCIN VARIABLE DOSE PER UNSTABLE RENAL FUNCTION (PHARMACIST DOSING): Status: DC

## 2021-12-15 MED ORDER — NOREPINEPHRINE 4 MG/250ML-% IV SOLN
INTRAVENOUS | Status: AC
  Administered 2021-12-15: 10 ug/min via INTRAVENOUS
  Filled 2021-12-15: qty 250

## 2021-12-15 MED ORDER — HEPARIN SODIUM (PORCINE) 5000 UNIT/ML IJ SOLN
5000.0000 [IU] | Freq: Three times a day (TID) | INTRAMUSCULAR | Status: DC
  Administered 2021-12-16 – 2021-12-17 (×4): 5000 [IU] via SUBCUTANEOUS
  Filled 2021-12-15 (×4): qty 1

## 2021-12-15 MED ORDER — PANTOPRAZOLE SODIUM 40 MG IV SOLR
40.0000 mg | Freq: Every day | INTRAVENOUS | Status: DC
  Administered 2021-12-15 – 2021-12-18 (×4): 40 mg via INTRAVENOUS
  Filled 2021-12-15 (×4): qty 40

## 2021-12-15 MED ORDER — LACTATED RINGERS IV BOLUS
1000.0000 mL | Freq: Once | INTRAVENOUS | Status: AC
  Administered 2021-12-15: 1000 mL via INTRAVENOUS

## 2021-12-15 MED ORDER — VANCOMYCIN HCL IN DEXTROSE 1-5 GM/200ML-% IV SOLN
1000.0000 mg | Freq: Once | INTRAVENOUS | Status: AC
  Administered 2021-12-15: 1000 mg via INTRAVENOUS
  Filled 2021-12-15: qty 200

## 2021-12-15 MED ORDER — METRONIDAZOLE 500 MG/100ML IV SOLN
500.0000 mg | Freq: Once | INTRAVENOUS | Status: AC
  Administered 2021-12-15: 500 mg via INTRAVENOUS
  Filled 2021-12-15: qty 100

## 2021-12-15 MED ORDER — ARFORMOTEROL TARTRATE 15 MCG/2ML IN NEBU
15.0000 ug | INHALATION_SOLUTION | Freq: Two times a day (BID) | RESPIRATORY_TRACT | Status: DC
  Administered 2021-12-15 – 2021-12-23 (×15): 15 ug via RESPIRATORY_TRACT
  Filled 2021-12-15 (×15): qty 2

## 2021-12-15 MED ORDER — CHLORHEXIDINE GLUCONATE 0.12% ORAL RINSE (MEDLINE KIT)
15.0000 mL | Freq: Two times a day (BID) | OROMUCOSAL | Status: DC
  Administered 2021-12-15 – 2021-12-23 (×16): 15 mL via OROMUCOSAL

## 2021-12-15 MED ORDER — SODIUM CHLORIDE 0.9 % IV SOLN
2.0000 g | Freq: Once | INTRAVENOUS | Status: AC
  Administered 2021-12-15: 2 g via INTRAVENOUS
  Filled 2021-12-15: qty 2

## 2021-12-15 MED ORDER — FENTANYL 2500MCG IN NS 250ML (10MCG/ML) PREMIX INFUSION
0.0000 ug/h | INTRAVENOUS | Status: DC
  Administered 2021-12-15: 25 ug/h via INTRAVENOUS
  Administered 2021-12-16 – 2021-12-17 (×2): 50 ug/h via INTRAVENOUS
  Administered 2021-12-18 – 2021-12-19 (×2): 100 ug/h via INTRAVENOUS
  Administered 2021-12-20: 125 ug/h via INTRAVENOUS
  Administered 2021-12-21 – 2021-12-23 (×4): 200 ug/h via INTRAVENOUS
  Filled 2021-12-15 (×9): qty 250

## 2021-12-15 MED ORDER — NOREPINEPHRINE 4 MG/250ML-% IV SOLN
0.0000 ug/min | INTRAVENOUS | Status: DC
  Administered 2021-12-15: 14 ug/min via INTRAVENOUS
  Administered 2021-12-16: 25 ug/min via INTRAVENOUS
  Administered 2021-12-16: 30 ug/min via INTRAVENOUS
  Administered 2021-12-16: 25 ug/min via INTRAVENOUS
  Administered 2021-12-16: 35 ug/min via INTRAVENOUS
  Administered 2021-12-16: 25 ug/min via INTRAVENOUS
  Administered 2021-12-16 (×3): 30 ug/min via INTRAVENOUS
  Administered 2021-12-17: 35 ug/min via INTRAVENOUS
  Administered 2021-12-17: 30 ug/min via INTRAVENOUS
  Administered 2021-12-17: 25 ug/min via INTRAVENOUS
  Administered 2021-12-17: 30 ug/min via INTRAVENOUS
  Administered 2021-12-17: 36 ug/min via INTRAVENOUS
  Administered 2021-12-17: 27 ug/min via INTRAVENOUS
  Filled 2021-12-15 (×8): qty 250
  Filled 2021-12-15: qty 500
  Filled 2021-12-15 (×8): qty 250

## 2021-12-15 MED ORDER — SODIUM CHLORIDE 0.9% FLUSH
10.0000 mL | Freq: Two times a day (BID) | INTRAVENOUS | Status: DC
  Administered 2021-12-15 – 2021-12-23 (×11): 10 mL

## 2021-12-15 MED ORDER — POLYETHYLENE GLYCOL 3350 17 G PO PACK: 17.0000 g | PACK | Freq: Every day | ORAL | Status: DC | PRN

## 2021-12-15 MED ORDER — IOHEXOL 350 MG/ML SOLN
60.0000 mL | Freq: Once | INTRAVENOUS | Status: AC | PRN
  Administered 2021-12-15: 60 mL via INTRAVENOUS

## 2021-12-15 MED ORDER — FENTANYL BOLUS VIA INFUSION
25.0000 ug | INTRAVENOUS | Status: DC | PRN
  Administered 2021-12-16 – 2021-12-21 (×14): 25 ug via INTRAVENOUS
  Filled 2021-12-15: qty 25

## 2021-12-15 MED ORDER — EPINEPHRINE HCL 5 MG/250ML IV SOLN IN NS
0.5000 ug/min | INTRAVENOUS | Status: DC
  Administered 2021-12-15: 10 ug/min via INTRAVENOUS

## 2021-12-15 MED ORDER — IPRATROPIUM-ALBUTEROL 0.5-2.5 (3) MG/3ML IN SOLN
3.0000 mL | Freq: Four times a day (QID) | RESPIRATORY_TRACT | Status: DC
  Administered 2021-12-15 – 2021-12-16 (×4): 3 mL via RESPIRATORY_TRACT
  Filled 2021-12-15 (×4): qty 3

## 2021-12-15 MED ORDER — SODIUM CHLORIDE 0.9 % IV SOLN
2.0000 g | INTRAVENOUS | Status: DC
  Administered 2021-12-16 – 2021-12-17 (×2): 2 g via INTRAVENOUS
  Filled 2021-12-15 (×2): qty 2

## 2021-12-15 MED ORDER — ETOMIDATE 2 MG/ML IV SOLN
INTRAVENOUS | Status: AC | PRN
  Administered 2021-12-15: 20 mg via INTRAVENOUS

## 2021-12-15 MED ORDER — LACTATED RINGERS IV SOLN: INTRAVENOUS | Status: AC

## 2021-12-15 MED ORDER — CHLORHEXIDINE GLUCONATE CLOTH 2 % EX PADS
6.0000 | MEDICATED_PAD | Freq: Every day | CUTANEOUS | Status: DC
  Administered 2021-12-15 – 2021-12-23 (×8): 6 via TOPICAL

## 2021-12-15 NOTE — Progress Notes (Signed)
Elink following code sepsis °

## 2021-12-15 NOTE — ED Notes (Signed)
This tech went to let family know it was okay to come back to patient's room. Family was not present in consultation A.

## 2021-12-15 NOTE — Progress Notes (Signed)
Patient transported to 2H24 from the ER w/o complications. Report given to unit RRT.   Bogdan Vivona L. Katrinka Blazing, BS, RRT-ACCS, RCP

## 2021-12-15 NOTE — H&P (Signed)
NAME:  Mary Avila, MRN:  [id number], DOB:  1951-09-05, LOS: 0 ADMISSION DATE:  12/25/2021 CONSULTATION DATE:  12/16/2021 REFERRING MD:  Reather Converse - EDP CHIEF COMPLAINT:  Post-PEA arrest   History of Present Illness:  71 year old woman who presented to [facility] ED 1/9 via EMS after PEA arrest at facility. Approximate 1 hour of downtime; CPR performed x 8 minutes with Epi x 1 administered and ROSC. PMHx significant for COPD, bipolar disorder, schizoaffective disorder, osteopenia.  No family/legal guardian present at time of examination. Per chart review, patient is a resident at [facility]. LKN was around 1330 on day of admission. Patient was found down at ~1430 with agonal breathing. She had reportedly been complaining of dyspnea earlier in the week/day of admission. On EMS arrival, patient was in PEA and CPR was initiated on arrival. Compressions x 8 minutes and Epi x 1 with ROSC. IO was placed for fluids/Epi gtt in the setting of hypotension with SBP 80s. Patient was subsequently transferred to [facility] ED.  On ED arrival, patient had GCS 3 and was intubated. ED workup was notable for WBC 27.6, Hgb 7.8, AKI with Cr 2.04 (baseline 0.8-1), AST 68 (24), trop 625 (post-arrest), BNP 1129. CTA Chest was obtained to r/o PE; negative for PE but demonstrated significant air-fluid level with mild-moderate thick-walled cavitary lesion of the superior RUL with findings concerning for multifocal infection. COVID/Flu negative. ABG demonstrated pH 7.015/pCO2 90.1/pO2 113/bicarb 23.1. BCx and respiratory Cx pending. Empiric broad-spectrum antibiotics were started.  PCCM was consulted for admission.  Pertinent Medical History:   Past Medical History:  Diagnosis Date   Bipolar disorder ([place])    COPD (chronic obstructive pulmonary disease) ([place])    Osteopenia    Personality disorder ([place])    Schizoaffective disorder    Weakness    Significant Hospital Events: Including procedures, antibiotic start and stop dates in  addition to other pertinent events   1/9 - OOH arrest at facility, LKN 1330. Found down 1430 with agonal respirations. EMS called and patient found to be in PEA. CPR x 8 mins, 1 Epi with ROSC. Epi gtt via R tibial IO for hypotension. GCS 3 on arrival. Intubated in ED. CTA without PE, +large RUL cavitary PNA superimposed on COPD. Empiric Zosyn/Vanc/Flagyl.   Interim History / Subjective:  PCCM consulted for admission  Objective:  Blood pressure 112/82, pulse 100, temperature 98.3 F (36.8 C), temperature source Temporal, resp. rate (!) 22, height 5\' 3"  (1.6 m), weight 46.8 kg, SpO2 92 %.    Vent Mode: PRVC FiO2 (%):  [51 %-100 %] 100 % Set Rate:  [16 bmp-28 bmp] 28 bmp Vt Set:  [410 mL] 410 mL PEEP:  [5 cmH20-10 cmH20] 10 cmH20 Plateau Pressure:  [14 cmH20] 14 cmH20   Intake/Output Summary (Last 24 hours) at 12/08/2021 2007 Last data filed at 12/17/2021 1914 Gross per 24 hour  Intake 1740.87 ml  Output --  Net 1740.87 ml   Filed Weights   01/02/2022 1649  Weight: 46.8 kg   Physical Examination: General: Chronically ill-appearing elderly woman in NAD. HEENT: [place]/AT, anicteric sclera, pupils equal, round and 44mm with +hippus, dry mucous membranes. OGT with brown output. Neuro:  Unresponsive.  Does not respond to verbal, tactile or noxious stimuli. Not following commands. No spontaneous movement of extremities noted. +Cough and +Gag  CV: Mild tachycardia, regular rhythm, no m/g/r. PULM: Breathing even and unlabored on vent (PEEP 10, FiO2 100%). Lung fields with coarse rhonchi bilaterally, R > L. GI: Soft,  nontender, midly distended. Normoactive bowel sounds. Extremities: No LE edema noted. Skin: Warm/dry, no rashes.  Resolved Hospital Problem List:    Assessment & Plan:  PEA arrest, out of hospital Undifferentiated shock, septic vs. Cardiogenic in the setting of PNA/PEA arrest Lactic acidosis, improving S/p OOH PEA arrest. Downtime ~1 hour. Required CPR x 8 min + Epi x 1 with ROSC.  Suspected hypoxemic arrest, given PNA. GCS 3 on arrival. - Admit to ICU - Goal MAP > 65 - Fluid resuscitation - Trend LA, WBC - F/u Cx data - Continue empiric antibiotics (as below) - F/u CT Head - F/u Echo - Trend troponins  RUL cavitary pneumonia CTA Chest negative for PE, demonstrating mild-moderate thick-walled cavitary lesion of RUL c/f multifocal pneumonia. No history of immunosuppressive medications. - Continue Zosyn, Vanc (1/9 >> ) - S/p Flagyl dose x 1 (1/9) - Low threshold for addition of fungal coverage, if worsening - F/u Respiratory Cx/TA - Follow CXR  Acute-on-chronic hypoxic and hypercarbic respiratory failure COPD Baseline COPD with moderate to high-grade centrilobular emphysema. - Continue full vent support (4-8cc/kg IBW) - Wean FiO2 for O2 sat > 90% - Daily WUA/SBT - VAP bundle - Pulmonary hygiene - Continue bronchodilators in the setting of COPD - PAD protocol for sedation: Fentanyl for goal RASS 0 to -1 - Repeat ABG 1/9PM  AKI Cr 2.04 on arrival, baseline 0.8-1 per previous labs (none recent) - Trend BMP - Replete electrolytes as indicated - Monitor I&Os - Avoid nephrotoxic agents as able - Ensure adequate renal perfusion  Schizoaffective disorder Bipolar disorder - Continue home medications as able - Has legal guardian in place  Best Practice: (right click and "Reselect all SmartList Selections" daily)   Diet/type: NPO, eventual TF DVT prophylaxis: SCD GI prophylaxis: PPI Lines: IO in place, R tibia Foley:  Yes, and it is still needed Code Status:  full code Last date of multidisciplinary goals of care discussion [Pending]  Labs:  CBC: Recent Labs  Lab 01/04/2022 1607 12/24/2021 1632 12/26/2021 1633 12/07/2021 1702  WBC 27.6*  --   --   --   NEUTROABS 25.4*  --   --   --   HGB 7.8* 8.8* 8.5* 8.5*  HCT 27.1* 26.0* 25.0* 25.0*  MCV 110.6*  --   --   --   PLT 388  --   --   --    Basic Metabolic Panel: Recent Labs  Lab 01/05/2022 1607  12/24/2021 1632 12/31/2021 1633 12/09/2021 1702  NA 142 142 142 141  K 4.8 4.7 4.7 4.5  CL 109 110  --   --   CO2 19*  --   --   --   GLUCOSE 141* 141*  --   --   BUN 27* 27*  --   --   CREATININE 2.04* 1.80*  --   --   CALCIUM 7.9*  --   --   --    GFR: Estimated Creatinine Clearance: 21.5 mL/min (A) (by C-G formula based on SCr of 1.8 mg/dL (H)). Recent Labs  Lab 12/12/2021 1601 12/18/2021 1607  WBC  --  27.6*  LATICACIDVEN 6.5*  --    Liver Function Tests: Recent Labs  Lab 12/22/2021 1607  AST 68*  ALT 26  ALKPHOS 79  BILITOT 0.4  PROT 5.9*  ALBUMIN 1.5*   No results for input(s): LIPASE, AMYLASE in the last 168 hours. No results for input(s): AMMONIA in the last 168 hours.  ABG:    Component Value  Date/Time   PHART 7.015 (LL) 12/14/2021 1702   PCO2ART 90.1 (HH) 12/08/2021 1702   PO2ART 113 (H) 01/04/2022 1702   HCO3 23.1 12/22/2021 1702   TCO2 26 12/20/2021 1702   ACIDBASEDEF 8.0 (H) 12/14/2021 1702   O2SAT 95.0 12/18/2021 1702    Coagulation Profile: Recent Labs  Lab 12/24/2021 1607  INR 1.4*   Cardiac Enzymes: No results for input(s): CKTOTAL, CKMB, CKMBINDEX, TROPONINI in the last 168 hours.  HbA1C: No results found for: HGBA1C  CBG: No results for input(s): GLUCAP in the last 168 hours.  Review of Systems:   Patient is encephalopathic and/or intubated. Therefore history has been obtained from chart review.   Past Medical History:  She,  has a past medical history of Bipolar disorder ([place]), COPD (chronic obstructive pulmonary disease) ([place]), Osteopenia, Personality disorder ([place]), Schizoaffective disorder, and Weakness.   Surgical History:   Past Surgical History:  Procedure Laterality Date   right wrist surgery      Social History:   reports that she has been smoking cigarettes. She has been smoking an average of 2.5 packs per day. She has never used smokeless tobacco. She reports current alcohol use. She reports that she does not use drugs.    Family History:  Her family history includes COPD in her father.   Allergies: No Known Allergies   Home Medications: Prior to Admission medications   Medication Sig Start Date End Date Taking? Authorizing Provider  benztropine (COGENTIN) 0.5 MG tablet Take 0.5 mg by mouth daily. 10/24/18  Yes [provider]  Fluticasone-Salmeterol (ADVAIR) 250-50 MCG/DOSE AEPB Inhale 1 puff into the lungs 2 (two) times daily. Rinse mouth after use.   Yes [provider]  LORazepam (ATIVAN) 0.5 MG tablet Take 0.5 mg by mouth daily. 10/10/18  Yes [provider]  OLANZapine (ZYPREXA) 10 MG tablet Take 10 mg by mouth daily.   Yes [provider]  tiotropium (SPIRIVA) 18 MCG inhalation capsule Place 18 mcg into inhaler and inhale daily.   Yes [provider]  topiramate (TOPAMAX) 25 MG tablet Take 25-50 mg by mouth See admin instructions. Take one tablet by mouth in the morning, then take two tablets by mouth at bedtime per [facility]   Yes [provider]  valproic acid (DEPAKENE) 250 MG capsule Take 2 capsules (500 mg total) by mouth 2 (two) times daily. Patient taking differently: Take 250-500 mg by mouth See admin instructions. Take one tablet by mouth in the morning, then take two tablets by mouth in the afternoon per [facility] 07/29/15  Yes Elgergawy, Silver Huguenin, MD  traMADol (ULTRAM) 50 MG tablet Take 1 tablet (50 mg total) by mouth 3 (three) times daily as needed. Patient not taking: Reported on 12/22/2021 10/26/18   Julianne Rice, MD    Critical care time: 48 minutes   Lestine Mount, PA-C [facility] 12/26/2021 8:07 PM  Please see [email or web address] for pager details.  From 7A-7P if no response, please call [phone] After hours, please call ELink [phone]

## 2021-12-15 NOTE — Progress Notes (Signed)
RT NOTES: Transported patient to CT and back to room TRAUMA A on vent with no incident.

## 2021-12-15 NOTE — ED Triage Notes (Signed)
Pt arrived to ED via EMS from [place] as a post CPR. EMS reports pt was last seen normal at 1330 and had an approximate downtime of 1 hr. EMS reports CPR was not initiated until they were on scene. EMS reports they gave 1 epi and 1L NS bolus. Initial rhythm was PEA, after epi and 8 mins of CPR pt regained pulses w/ HR 110 ST. Initial BP 116/70 which dropped to 80/50. EMS had initiated epi gtt at 1mcg and then increased to 41mcg d/t BP. CBG 220, Capnography 35-40. 18g R hand, R tibial IO. Pt arrived w/ a king airway.

## 2021-12-15 NOTE — ED Provider Notes (Signed)
[facility] Provider Note   CSN: [id number] Arrival date & time: 12/14/2021  1536     History  Chief Complaint  Patient presents with   Post Cardiac Arrest   Code Sepsis    Mary Avila is a 71 y.o. female with past medical history significant for COPD, bipolar disorder who presents after PEA arrest.  Patient presents from facility where she was last seen normal around 2:30 PM.  Around 3:30, patient was discovered to be unresponsive with agonal breathing by nursing staff.  EMS was called and when they arrived the patient was found to be in PEA.  CPR was performed for about 8 minutes and 1 dose of epi was given with ROSC.  The patient was started on a epi drip for hypotension and received about 1 L of fluids prior to arrival. She had reportedly been complaining of breathing difficulties earlier in the day.     Home Medications Prior to Admission medications   Medication Sig Start Date End Date Taking? Authorizing Provider  benztropine (COGENTIN) 0.5 MG tablet Take 0.5 mg by mouth daily. 10/24/18  Yes [provider]  Fluticasone-Salmeterol (ADVAIR) 250-50 MCG/DOSE AEPB Inhale 1 puff into the lungs 2 (two) times daily. Rinse mouth after use.   Yes [provider]  LORazepam (ATIVAN) 0.5 MG tablet Take 0.5 mg by mouth daily. 10/10/18  Yes [provider]  OLANZapine (ZYPREXA) 10 MG tablet Take 10 mg by mouth daily.   Yes [provider]  tiotropium (SPIRIVA) 18 MCG inhalation capsule Place 18 mcg into inhaler and inhale daily.   Yes [provider]  topiramate (TOPAMAX) 25 MG tablet Take 25-50 mg by mouth See admin instructions. Take one tablet by mouth in the morning, then take two tablets by mouth at bedtime per [facility]   Yes [provider]  valproic acid (DEPAKENE) 250 MG capsule Take 2 capsules (500 mg total) by mouth 2 (two) times daily. Patient taking differently: Take 250-500 mg by mouth See  admin instructions. Take one tablet by mouth in the morning, then take two tablets by mouth in the afternoon per [facility] 07/29/15  Yes Elgergawy, Silver Huguenin, MD  traMADol (ULTRAM) 50 MG tablet Take 1 tablet (50 mg total) by mouth 3 (three) times daily as needed. Patient not taking: Reported on 01/06/2022 10/26/18   Julianne Rice, MD      Allergies    Patient has no known allergies.    Review of Systems   Review of Systems  Unable to perform ROS: Patient unresponsive   Physical Exam Updated Vital Signs BP 107/74    Pulse 100    Temp 98.3 F (36.8 C) (Temporal)    Resp (!) 21    Ht _0  (1.6 m)    Wt 46.8 kg    SpO2 99%    BMI 18.26 kg/m  Physical Exam Vitals and nursing note reviewed.  Constitutional:      General: She is in acute distress.     Appearance: She is well-developed. She is ill-appearing.  HENT:     Head: Normocephalic and atraumatic.     Right Ear: External ear normal.     Left Ear: External ear normal.     Nose: Nose normal.     Mouth/Throat:     Pharynx: Oropharynx is clear.  Eyes:     Conjunctiva/sclera: Conjunctivae normal.     Pupils: Pupils are equal, round, and reactive to light.  Cardiovascular:  Rate and Rhythm: Normal rate and regular rhythm.     Pulses: Normal pulses.     Heart sounds: No murmur heard. Pulmonary:     Breath sounds: Normal breath sounds.     Comments: Breathing assisted via BVM thru [facility] airway Abdominal:     Palpations: Abdomen is soft.     Tenderness: There is no abdominal tenderness.  Musculoskeletal:        General: No swelling.     Cervical back: Neck supple.  Skin:    General: Skin is warm and dry.     Capillary Refill: Capillary refill takes 2 to 3 seconds.  Neurological:     Mental Status: She is unresponsive.     GCS: GCS eye subscore is 1. GCS verbal subscore is 1. GCS motor subscore is 1.    ED Results / Procedures / Treatments   Labs (all labs ordered are listed, but only abnormal results are displayed) Labs  Reviewed  LACTIC ACID, PLASMA - Abnormal; Notable for the following components:      Result Value   Lactic Acid, Venous 6.5 (*)    All other components within normal limits  COMPREHENSIVE METABOLIC PANEL - Abnormal; Notable for the following components:   CO2 19 (*)    Glucose, Bld 141 (*)    BUN 27 (*)    Creatinine, Ser 2.04 (*)    Calcium 7.9 (*)    Total Protein 5.9 (*)    Albumin 1.5 (*)    AST 68 (*)    GFR, Estimated 26 (*)    All other components within normal limits  CBC WITH DIFFERENTIAL/PLATELET - Abnormal; Notable for the following components:   WBC 27.6 (*)    RBC 2.45 (*)    Hemoglobin 7.8 (*)    HCT 27.1 (*)    MCV 110.6 (*)    MCHC 28.8 (*)    Neutro Abs 25.4 (*)    Monocytes Absolute 1.1 (*)    nRBC 1 (*)    All other components within normal limits  PROTIME-INR - Abnormal; Notable for the following components:   Prothrombin Time 17.3 (*)    INR 1.4 (*)    All other components within normal limits  BRAIN NATRIURETIC PEPTIDE - Abnormal; Notable for the following components:   B Natriuretic Peptide 1,129.5 (*)    All other components within normal limits  I-STAT CHEM 8, ED - Abnormal; Notable for the following components:   BUN 27 (*)    Creatinine, Ser 1.80 (*)    Glucose, Bld 141 (*)    Calcium, Ion 1.14 (*)    Hemoglobin 8.8 (*)    HCT 26.0 (*)    All other components within normal limits  I-STAT VENOUS BLOOD GAS, ED - Abnormal; Notable for the following components:   pH, Ven 7.019 (*)    pCO2, Ven 76.8 (*)    pO2, Ven 149.0 (*)    Bicarbonate 19.8 (*)    Acid-base deficit 11.0 (*)    Calcium, Ion 1.14 (*)    HCT 25.0 (*)    Hemoglobin 8.5 (*)    All other components within normal limits  I-STAT ARTERIAL BLOOD GAS, ED - Abnormal; Notable for the following components:   pH, Arterial 7.015 (*)    pCO2 arterial 90.1 (*)    pO2, Arterial 113 (*)    Acid-base deficit 8.0 (*)    HCT 25.0 (*)    Hemoglobin 8.5 (*)    All other components  within  normal limits  TROPONIN I (HIGH SENSITIVITY) - Abnormal; Notable for the following components:   Troponin I (High Sensitivity) 625 (*)    All other components within normal limits  TROPONIN I (HIGH SENSITIVITY) - Abnormal; Notable for the following components:   Troponin I (High Sensitivity) 549 (*)    All other components within normal limits  RESP PANEL BY RT-PCR (FLU A&B, COVID) ARPGX2  CULTURE, BLOOD (ROUTINE X 2)  CULTURE, BLOOD (ROUTINE X 2)  URINE CULTURE  LACTIC ACID, PLASMA  APTT  URINALYSIS, ROUTINE W REFLEX MICROSCOPIC  BLOOD GAS, ARTERIAL  BLOOD GAS, ARTERIAL  HIV ANTIBODY (ROUTINE TESTING W REFLEX)  CBC  BLOOD GAS, ARTERIAL  MAGNESIUM  PHOSPHORUS  COMPREHENSIVE METABOLIC PANEL  I-STAT ARTERIAL BLOOD GAS, ED    EKG None  Radiology CT Angio Chest PE W and/or Wo Contrast  Result Date: 12/28/2021 CLINICAL DATA:  Last seen normal at 13:30 hours. Post cardiac arrest. Approximally down time of 1 hour. Status post CPR by EMS. Evaluate for pulmonary embolism. EXAM: CT ANGIOGRAPHY CHEST WITH CONTRAST TECHNIQUE: Multidetector CT imaging of the chest was performed using the standard protocol during bolus administration of intravenous contrast. Multiplanar CT image reconstructions and MIPs were obtained to evaluate the vascular anatomy. CONTRAST:  21m OMNIPAQUE IOHEXOL 350 MG/ML SOLN COMPARISON:  AP chest 12/16/2021 03/17/2016 FINDINGS: There is an endotracheal tube with tip terminating approximately 1.6 cm above the carina. As stated on prior AP chest radiograph, this can be retracted approximately 2-3 cm. An enteric tube descends below the diaphragm with the tip terminating within the proximal stomach. This also can be advanced approximately 10 cm for the side port to be within the stomach. A likely esophageal probe terminates within the distal esophagus. Cardiovascular: Satisfactory opacification of the pulmonary arteries to the segmental level. There is attenuation of the pulmonary  vessels within the segmental level of the bilateral upper lobes, likely from a combination of emphysematous change and the below described right upper lung cavitary lesion. Heart size is at the upper limits of normal. No pericardial effusion. The ascending aorta measures up to 3.4 cm, ectatic but not aneurysmal. Mild atherosclerotic calcifications within the thoracic aorta. Mediastinum/Nodes: 1.2 cm mildly enlarged right hilar lymph node (axial series 5, image 75). No definite enlarged mediastinal lymph nodes. No axillary lymphadenopathy by CT criteria. Lungs/Pleura: The central airways are patent. There is diffuse high-grade centrilobular emphysematous change. There is right greater than left mid and lower lung interlobular septal thickening. There is moderate to high-grade consolidation of the posterior aspect of the right lower lobe. There is an apparent cavitary cystic lesion within the superior 40% of the right lung, measuring up to approximately 8.6 cm in craniocaudal dimension and involving the majority of the transverse dimension of the lung with an air-fluid level and fluid layering within the dependent 25% of this cavitary region. There is mild to moderate thickening of the pleura of the superior 50% of the right lung, measuring up to proximally 3 mm in transverse thickness. There is curvilinear scarring within the anterior left upper lobe. Mild-to-moderate consolidation within the inferior lingula. Upper Abdomen: No acute abnormality. Musculoskeletal: No chest wall abnormality. No acute or significant osseous findings. Review of the MIP images confirms the above findings. IMPRESSION:: IMPRESSION: 1. No acute pulmonary embolism is seen. 2. There is an air-fluid level within a mildly to moderately thick-walled large cavitary lesion within the superior aspect of the right upper lobe, as also visualized on radiograph earlier today.  There is also diffuse moderate consolidation of the mid to posterior aspect of  the right lower lobe and inferior lingula. Findings are suspicious for multifocal infection. Both bacterial and fungal/mycobacterial infections are considered. 3. Moderate to high-grade centrilobular emphysema. 4. Endotracheal tube tip again terminates approximately 1.6 cm above the carina. Consider retraction. 5. Enteric tube tip again terminates within the proximal stomach. Consider advancement. Electronically Signed   By: Yvonne Kendall   On: 01/06/2022 17:53   DG Chest Portable 1 View  Result Date: 01/04/2022 CLINICAL DATA:  Status post cardiac arrest, difficulty breathing EXAM: PORTABLE CHEST 1 VIEW COMPARISON:  03/17/2016 FINDINGS: Transverse diameter of heart is increased. Increased interstitial markings are seen in both parahilar regions and both lower lung fields, more so on the right side. There is honeycombing in the right mid and right lower lung fields. There is patchy infiltrate in the right upper lobe with possible central cavitation. Tip of endotracheal tube is 1 cm above the carina. Tip of enteric tube is seen in the medial fundus of the stomach. Side port in the NG tube is seen in the lower thoracic esophagus. IMPRESSION: COPD. Increased interstitial markings are seen in the parahilar regions and lower lung fields on both sides, more so on the right side. This may suggest asymmetric pulmonary edema or interstitial pneumonia. Honeycombing is seen in the right lower lung fields suggesting possible pulmonary fibrosis. There is patchy infiltrate in the right upper lobe with possible cavitation in the lateral aspect of the infiltrate. This may suggest necrotizing pneumonia or necrotic neoplastic process. Tip of endotracheal tube is 1 cm from the carina and should be pulled back 2-3 cm. Side-port in the enteric tube is seen in the lower thoracic esophagus. Enteric tube should be advanced approximately 10 cm to place the side port within the stomach. These results will be called to the ordering  clinician or representative by the Radiologist Assistant, and communication documented in the PACS or [employer]. Electronically Signed   By: Elmer Picker M.D.   On: 12/21/2021 16:20   DG Abd Portable 1 View  Result Date: 12/19/2021 CLINICAL DATA:  Orogastric tube placement EXAM: PORTABLE ABDOMEN - 1 VIEW COMPARISON:  Portable exam 1622 hours without priors for comparison FINDINGS: Tip of orogastric tube projects over stomach with the proximal side-port projects over the distal esophagus, recommend advancing tube 6 cm. Air-filled bowel loops throughout abdomen. Increased stool RIGHT colon. No bowel wall thickening. Bones demineralized. IMPRESSION: Recommend advancing nasogastric tube 6 cm. Air-filled bowel loops throughout abdomen. Electronically Signed   By: Lavonia Dana M.D.   On: 12/22/2021 16:35    Procedures Procedure Name: Intubation Date/Time: 12/31/2021 4:30 PM Performed by: Varney Baas, MD Pre-anesthesia Checklist: Patient identified, Emergency Drugs available, Suction available, Patient being monitored and Timeout performed Oxygen Delivery Method: Ambu bag Preoxygenation: Pre-oxygenation with 100% oxygen Induction Type: Rapid sequence Ventilation: Oral airway inserted - appropriate to patient size Laryngoscope Size: Mac and 3 Tube size: 7.5 mm Number of attempts: 1 Airway Equipment and Method: Video-laryngoscopy Placement Confirmation: ETT inserted through vocal cords under direct vision, Positive ETCO2 and Breath sounds checked- equal and bilateral Secured at: 22 cm Tube secured with: ETT holder     Medications Ordered in ED Medications  EPINEPHrine (ADRENALIN) 5 mg in NS 250 mL (0.02 mg/mL) premix infusion (0 mcg/min Intravenous Stopped 01/06/2022 1820)  lactated ringers infusion ( Intravenous Infusion Verify 12/20/2021 1914)  fentaNYL 2546mg in NS 2559m(1028mml) infusion-PREMIX (50 mcg/hr Intravenous Rate/Dose  Change 12/10/2021 2024)  fentaNYL (SUBLIMAZE) bolus via  infusion 25 mcg (has no administration in time range)  ceFEPIme (MAXIPIME) 2 g in sodium chloride 0.9 % 100 mL IVPB (has no administration in time range)  vancomycin variable dose per unstable renal function (pharmacist dosing) (has no administration in time range)  docusate sodium (COLACE) capsule 100 mg (has no administration in time range)  polyethylene glycol (MIRALAX / GLYCOLAX) packet 17 g (has no administration in time range)  heparin injection 5,000 Units (has no administration in time range)  pantoprazole (PROTONIX) injection 40 mg (has no administration in time range)  etomidate (AMIDATE) injection (20 mg Intravenous Given 12/27/2021 1549)  rocuronium (ZEMURON) injection (80 mg Intravenous Given 12/24/2021 1549)  lactated ringers bolus 1,000 mL (1,000 mLs Intravenous New Bag/Given 01/05/2022 1628)  ceFEPIme (MAXIPIME) 2 g in sodium chloride 0.9 % 100 mL IVPB (0 g Intravenous Stopped 12/25/2021 1702)  metroNIDAZOLE (FLAGYL) IVPB 500 mg (0 mg Intravenous Stopped 01/03/2022 1837)  vancomycin (VANCOCIN) IVPB 1000 mg/200 mL premix (0 mg Intravenous Stopped 12/24/2021 1836)  fentaNYL (SUBLIMAZE) injection 50 mcg (0 mcg Intravenous Not Given 12/31/2021 1913)  iohexol (OMNIPAQUE) 350 MG/ML injection 60 mL (60 mLs Intravenous Contrast Given 12/22/2021 1727)    ED Course/ Medical Decision Making/ A&P                           Patient is critically ill presenting after PEA arrest at her facility.  Upon arrival, the patient was unresponsive with GCS 3, saturating well with BVM via [facility] airway, and hypotensive to 66Y systolic.  The patient was started on an epinephrine infusion and systolic blood pressure above 100 prior to rapid sequence intubation according to the procedure note above.  Severe septic shock and hypotension leading to PEA arrest most likely etiology of patient's presentation however cardiac etiologies and pulmonary embolism remain on the differential.  Broad sepsis work-up initiated with broad-spectrum  antibiotics, basic labs, lactic, venous blood gas, blood cultures, urinalysis.  Broad-spectrum antibiotic coverage ordered with cefepime, Flagyl, and vancomycin.  Troponin, BNP, EKG ordered to evaluate for cardiac etiologies.  Will obtain CTA of the chest to evaluate for pulmonary embolism and intrathoracic pathology.  Patient already received 1 L of fluids with EMS.  Her 30 cc/kg sepsis fluid bolus would be about 1.4 L.  Given she is still hypotensive we will administer another liter of fluids.  We will continue epinephrine drip and wean as tolerated for MAP greater than 65.  EKG shows sinus tachycardia without any signs of arrhythmia or acute ischemia.  CTA of the chest reviewed by myself with no acute pulmonary embolism however there is an air-fluid level within a mildly to moderately thick-walled cavitary lesion of the right upper lobe with diffuse moderate consolidation of the mid to posterior aspect of the right lower lobe and inferior lingula.  The patient is already received broad-spectrum antibiotic coverage.  I will discuss the patient with intensivist on-call prior to adding additional antifungal or mycobacterial coverage.    I discussed the patient with her legal guardian who stated that the patient has severe COPD and psychiatric issues, but is not on any immunosuppressive medications.  She said that the patient had stated that she "did not feel well" for about a week, but had not had any specific symptoms.  Lab findings reviewed by myself significant for lactic acidosis (lactic acid 6.5), leukocytosis (WBC 27.6), macrocytic anemia (Hgb 7.8, MCV 110.6), troponinemia (trop  625).  RVP negative for COVID or flu.  VBG demonstrates respiratory acidosis confirmed on ABG.  RT increased respiratory rate to 28 and we will recheck ABG to monitor for improvement in respiratory acidosis.  Leukocytosis and lactic acidosis likely secondary to severe sepsis from pneumonia.  Unclear etiology of macrocytic  anemia as I do not have any recent CBCs for comparison and the patient has no obvious signs of bleeding.  Troponin elevated in the setting of CPR and PEA arrest and there are no signs of ischemia on EKG.  I discussed the patient with the intensivist on-call who will admit the patient to the ICU.  Final Clinical Impression(s) / ED Diagnoses Final diagnoses:  Septic shock [facility])    Rx / DC Orders ED Discharge Orders     None         Velmer Broadfoot, Amalia Hailey, MD 01/01/2022 2162    Elnora Morrison, MD 12/19/21 1258

## 2021-12-15 NOTE — Progress Notes (Signed)
Pharmacy Antibiotic Note  Mary Avila is a 71 y.o. female admitted on 12/16/21 with sepsis.  Pharmacy has been consulted for cefepime and vancomycin dosing.  Patient with a history of COPD, bipolar disorder. Patient presenting as post-arrest.  SCr 1.8 - above baseline WBC 27.6; LA 6.5; T 98.3 F  Plan: Metronidazole per MD Cefepime 2g q24hr Vancomycin 1000 mg once, subsequent dosing as indicated per random vancomycin level until renal function stable and/or improved, at which time scheduled dosing can be considered Trend WBC, Fever, Renal function, & Clinical course F/u cultures, clinical course, WBC, fever De-escalate when able  Height: 5\' 3"  (160 cm) IBW/kg (Calculated) : 52.4  Temp (24hrs), Avg:98.3 F (36.8 C), Min:98.3 F (36.8 C), Max:98.3 F (36.8 C)  No results for input(s): WBC, CREATININE, LATICACIDVEN, VANCOTROUGH, VANCOPEAK, VANCORANDOM, GENTTROUGH, GENTPEAK, GENTRANDOM, TOBRATROUGH, TOBRAPEAK, TOBRARND, AMIKACINPEAK, AMIKACINTROU, AMIKACIN in the last 168 hours.  CrCl cannot be calculated (Patient's most recent lab result is older than the maximum 21 days allowed.).    No Known Allergies  Antimicrobials this admission: cefepime 1/9 >>  metronidazole 1/9 >>  vancomycin 1/9 >>   Microbiology results: Pending  Thank you for allowing pharmacy to be a part of this patients care.  3/9, PharmD, BCPS 16-Dec-2021 4:08 PM ED Clinical Pharmacist -  [phone]

## 2021-12-15 NOTE — Procedures (Signed)
Arterial Catheter Insertion Procedure Note  Mary Avila  [id number]  [birth year]  Date:12/25/2021  Time:11:30 PM    Provider Performing: Inez Pilgrim    Procedure: Insertion of Arterial Line (13244) without US guidance  Indication(s) Blood pressure monitoring and/or need for frequent ABGs  Consent Unable to obtain consent due to emergent nature of procedure.  Anesthesia None   Time Out Verified patient identification, verified procedure, site/side was marked, verified correct patient position, special equipment/implants available, medications/allergies/relevant history reviewed, required imaging and test results available.   Sterile Technique Maximal sterile technique including full sterile barrier drape, hand hygiene, sterile gown, sterile gloves, mask, hair covering, sterile ultrasound probe cover (if used).   Procedure Description Area of catheter insertion was cleaned with chlorhexidine and draped in sterile fashion. Without real-time ultrasound guidance an arterial catheter was placed into the right radial artery.  Appropriate arterial tracings confirmed on monitor.     Complications/Tolerance None; patient tolerated the procedure well.   EBL Minimal   Specimen(s) None

## 2021-12-15 NOTE — Progress Notes (Signed)
Notified bedside nurse of need to draw repeat lactic acid. 

## 2021-12-15 NOTE — Procedures (Signed)
Central Venous Catheter Insertion Procedure Note  Mary Avila  [id number]  Mar 17, 1951  Date:12/27/2021  Time:11:53 PM   Provider Performing:Masey Scheiber Jerilynn Mages Ayesha Rumpf   Procedure: Insertion of Non-tunneled Central Venous Catheter(36556) with US guidance [id number])   Indication(s) Medication administration and Difficult access  Consent Unable to obtain consent due to emergent nature of procedure.  Anesthesia Topical only with 1% lidocaine   Timeout Verified patient identification, verified procedure, site/side was marked, verified correct patient position, special equipment/implants available, medications/allergies/relevant history reviewed, required imaging and test results available.  Sterile Technique Maximal sterile technique including full sterile barrier drape, hand hygiene, sterile gown, sterile gloves, mask, hair covering, sterile ultrasound probe cover (if used).  Procedure Description Area of catheter insertion was cleaned with chlorhexidine and draped in sterile fashion.  With real-time ultrasound guidance a central venous catheter was placed into the left internal jugular vein. Nonpulsatile blood flow and easy flushing noted in all ports.  The catheter was sutured in place and sterile dressing applied.     Complications/Tolerance None; patient tolerated the procedure well. Chest X-ray is ordered to verify placement for internal jugular or subclavian cannulation.   Chest x-ray is not ordered for femoral cannulation.  EBL Minimal  Specimen(s) None   Lestine Mount, [place] [facility] 12/27/2021 11:54 PM  Please see [email or web address] for pager details.

## 2021-12-15 NOTE — Progress Notes (Signed)
eLink Physician-Brief Progress Note Patient Name: Mary Avila DOB: 1951/05/15 MRN: [id number]   Date of Service  01/01/2022  HPI/Events of Note  Patient admitted via ED following out of hospital PEA cardiac arrest with suspected prolonged downtime prior to resuscitation, she has acute hypoxemic / hypercapnic respiratory failure for which she is intubated and mechanically ventilated, she has septic shock secondary to cavitary pneumonia, she also has acute kidney injury. She is comatose and hypotensive s/p cardiac arrest  eICU Interventions  New Patient Evaluation. Norepinephrine gtt ordered, PCCM ground crew requested to insert CVC catheter, RT to insert arterial line, ET tube retracted 2 cm, respiratory rate increased to 32 from 28 and interval ABG ordered.  Post- retraction of ET tube ordered.        [name] Mary Avila 12/27/2021, 10:47 PM

## 2021-12-16 ENCOUNTER — Inpatient Hospital Stay (HOSPITAL_COMMUNITY): Payer: Medicare Other

## 2021-12-16 DIAGNOSIS — A4101 Sepsis due to Methicillin susceptible Staphylococcus aureus: Secondary | ICD-10-CM | POA: Diagnosis not present

## 2021-12-16 DIAGNOSIS — J984 Other disorders of lung: Secondary | ICD-10-CM

## 2021-12-16 DIAGNOSIS — R778 Other specified abnormalities of plasma proteins: Secondary | ICD-10-CM | POA: Diagnosis not present

## 2021-12-16 DIAGNOSIS — I469 Cardiac arrest, cause unspecified: Secondary | ICD-10-CM | POA: Diagnosis not present

## 2021-12-16 DIAGNOSIS — J9602 Acute respiratory failure with hypercapnia: Secondary | ICD-10-CM

## 2021-12-16 DIAGNOSIS — J9601 Acute respiratory failure with hypoxia: Secondary | ICD-10-CM

## 2021-12-16 DIAGNOSIS — Z9911 Dependence on respirator [ventilator] status: Secondary | ICD-10-CM | POA: Diagnosis not present

## 2021-12-16 DIAGNOSIS — R053 Chronic cough: Secondary | ICD-10-CM | POA: Diagnosis not present

## 2021-12-16 DIAGNOSIS — A419 Sepsis, unspecified organism: Secondary | ICD-10-CM | POA: Diagnosis not present

## 2021-12-16 DIAGNOSIS — R6521 Severe sepsis with septic shock: Secondary | ICD-10-CM

## 2021-12-16 LAB — GLUCOSE, CAPILLARY
Glucose-Capillary: 147 mg/dL — ABNORMAL HIGH (ref 70–99)
Glucose-Capillary: 164 mg/dL — ABNORMAL HIGH (ref 70–99)
Glucose-Capillary: 167 mg/dL — ABNORMAL HIGH (ref 70–99)
Glucose-Capillary: 143 mg/dL — ABNORMAL HIGH (ref 70–99)

## 2021-12-16 LAB — COMPREHENSIVE METABOLIC PANEL WITH GFR
ALT: 25 U/L (ref 0–44)
AST: 46 U/L — ABNORMAL HIGH (ref 15–41)
Albumin: <1.5 g/dL — ABNORMAL LOW (ref 3.5–5.0)
Alkaline Phosphatase: 71 U/L (ref 38–126)
Anion gap: 9 (ref 5–15)
BUN: 29 mg/dL — ABNORMAL HIGH (ref 8–23)
CO2: 22 mmol/L (ref 22–32)
Calcium: 7.4 mg/dL — ABNORMAL LOW (ref 8.9–10.3)
Chloride: 109 mmol/L (ref 98–111)
Creatinine, Ser: 1.79 mg/dL — ABNORMAL HIGH (ref 0.44–1.00)
GFR, Estimated: 30 mL/min — ABNORMAL LOW
Glucose, Bld: 166 mg/dL — ABNORMAL HIGH (ref 70–99)
Potassium: 4.4 mmol/L (ref 3.5–5.1)
Sodium: 140 mmol/L (ref 135–145)
Total Bilirubin: 0.3 mg/dL (ref 0.3–1.2)
Total Protein: 5.5 g/dL — ABNORMAL LOW (ref 6.5–8.1)

## 2021-12-16 LAB — ECHOCARDIOGRAM COMPLETE
AR max vel: 2.08 cm2
AV Peak grad: 19.3 mmHg
Ao pk vel: 2.2 m/s
Area-P 1/2: 3.76 cm2
Height: 63 in
S' Lateral: 2.5 cm
Weight: 1682.55 [oz_av]

## 2021-12-16 LAB — POCT I-STAT 7, (LYTES, BLD GAS, ICA,H+H)
Acid-base deficit: 7 mmol/L — ABNORMAL HIGH (ref 0.0–2.0)
Bicarbonate: 21.1 mmol/L (ref 20.0–28.0)
Calcium, Ion: 1.19 mmol/L (ref 1.15–1.40)
HCT: 27 % — ABNORMAL LOW (ref 36.0–46.0)
Hemoglobin: 9.2 g/dL — ABNORMAL LOW (ref 12.0–15.0)
O2 Saturation: 94 %
Patient temperature: 37.8
Potassium: 4.5 mmol/L (ref 3.5–5.1)
Sodium: 140 mmol/L (ref 135–145)
TCO2: 23 mmol/L (ref 22–32)
pCO2 arterial: 54.7 mmHg — ABNORMAL HIGH (ref 32.0–48.0)
pH, Arterial: 7.199 — CL (ref 7.350–7.450)
pO2, Arterial: 91 mmHg (ref 83.0–108.0)

## 2021-12-16 LAB — CBC
HCT: 28.5 % — ABNORMAL LOW (ref 36.0–46.0)
Hemoglobin: 8.2 g/dL — ABNORMAL LOW (ref 12.0–15.0)
MCH: 31.2 pg (ref 26.0–34.0)
MCHC: 28.8 g/dL — ABNORMAL LOW (ref 30.0–36.0)
MCV: 108.4 fL — ABNORMAL HIGH (ref 80.0–100.0)
Platelets: 327 K/uL (ref 150–400)
RBC: 2.63 MIL/uL — ABNORMAL LOW (ref 3.87–5.11)
RDW: 14.6 % (ref 11.5–15.5)
WBC: 19.1 K/uL — ABNORMAL HIGH (ref 4.0–10.5)
nRBC: 0 % (ref 0.0–0.2)

## 2021-12-16 LAB — BLOOD GAS, VENOUS
Acid-base deficit: 6.4 mmol/L — ABNORMAL HIGH (ref 0.0–2.0)
Bicarbonate: 20.6 mmol/L (ref 20.0–28.0)
FIO2: 60
O2 Saturation: 71.3 %
Patient temperature: 37
pCO2, Ven: 55.9 mmHg (ref 44.0–60.0)
pH, Ven: 7.191 — CL (ref 7.250–7.430)
pO2, Ven: 45.1 mmHg — ABNORMAL HIGH (ref 32.0–45.0)

## 2021-12-16 LAB — URINE CULTURE: Culture: NO GROWTH

## 2021-12-16 LAB — MAGNESIUM
Magnesium: 1.9 mg/dL (ref 1.7–2.4)
Magnesium: 1.8 mg/dL (ref 1.7–2.4)

## 2021-12-16 LAB — HIV ANTIBODY (ROUTINE TESTING W REFLEX): HIV Screen 4th Generation wRfx: NONREACTIVE

## 2021-12-16 LAB — COOXEMETRY PANEL
Carboxyhemoglobin: 1 % (ref 0.5–1.5)
Methemoglobin: 1.1 % (ref 0.0–1.5)
O2 Saturation: 74.4 %
Total hemoglobin: 8.1 g/dL — ABNORMAL LOW (ref 12.0–16.0)

## 2021-12-16 LAB — MRSA NEXT GEN BY PCR, NASAL: MRSA by PCR Next Gen: NOT DETECTED

## 2021-12-16 LAB — PHOSPHORUS
Phosphorus: 5.1 mg/dL — ABNORMAL HIGH (ref 2.5–4.6)
Phosphorus: 5.2 mg/dL — ABNORMAL HIGH (ref 2.5–4.6)

## 2021-12-16 MED ORDER — BENZTROPINE MESYLATE 0.5 MG PO TABS
0.5000 mg | ORAL_TABLET | Freq: Every day | ORAL | Status: DC
  Administered 2021-12-16 – 2021-12-21 (×6): 0.5 mg
  Filled 2021-12-16 (×7): qty 1

## 2021-12-16 MED ORDER — MIDAZOLAM HCL 2 MG/2ML IJ SOLN
2.0000 mg | INTRAMUSCULAR | Status: DC | PRN
  Administered 2021-12-16 – 2021-12-17 (×2): 2 mg via INTRAVENOUS
  Filled 2021-12-16 (×4): qty 2

## 2021-12-16 MED ORDER — OLANZAPINE 10 MG PO TABS
10.0000 mg | ORAL_TABLET | Freq: Every day | ORAL | Status: DC
  Administered 2021-12-16 – 2021-12-18 (×3): 10 mg
  Filled 2021-12-16 (×4): qty 1

## 2021-12-16 MED ORDER — FENTANYL NICU BOLUS VIA INFUSION
100.0000 ug | Freq: Once | INTRAVENOUS | Status: AC
  Administered 2021-12-16: 100 ug via INTRAVENOUS
  Filled 2021-12-16: qty 10

## 2021-12-16 MED ORDER — ADULT MULTIVITAMIN LIQUID CH
15.0000 mL | Freq: Every day | ORAL | Status: DC
  Administered 2021-12-16 – 2021-12-23 (×8): 15 mL
  Filled 2021-12-16 (×8): qty 15

## 2021-12-16 MED ORDER — VASOPRESSIN 20 UNITS/100 ML INFUSION FOR SHOCK
0.0000 [IU]/min | INTRAVENOUS | Status: DC
  Administered 2021-12-17: 0.04 [IU]/min via INTRAVENOUS
  Administered 2021-12-17 (×2): 0.03 [IU]/min via INTRAVENOUS
  Administered 2021-12-18 – 2021-12-19 (×4): 0.04 [IU]/min via INTRAVENOUS
  Administered 2021-12-19 – 2021-12-22 (×5): 0.03 [IU]/min via INTRAVENOUS
  Filled 2021-12-16 (×12): qty 100

## 2021-12-16 MED ORDER — ACETAMINOPHEN 160 MG/5ML PO SOLN
650.0000 mg | ORAL | Status: AC | PRN
  Administered 2021-12-16 – 2021-12-18 (×2): 650 mg
  Filled 2021-12-16 (×2): qty 20.3

## 2021-12-16 MED ORDER — IPRATROPIUM-ALBUTEROL 0.5-2.5 (3) MG/3ML IN SOLN
3.0000 mL | RESPIRATORY_TRACT | Status: DC
  Administered 2021-12-16 – 2021-12-20 (×22): 3 mL via RESPIRATORY_TRACT
  Filled 2021-12-16 (×22): qty 3

## 2021-12-16 MED ORDER — MIDAZOLAM HCL 2 MG/2ML IJ SOLN
2.0000 mg | Freq: Once | INTRAMUSCULAR | Status: AC
  Administered 2021-12-16: 2 mg via INTRAVENOUS
  Filled 2021-12-16: qty 2

## 2021-12-16 MED ORDER — METHYLPREDNISOLONE SODIUM SUCC 40 MG IJ SOLR
40.0000 mg | INTRAMUSCULAR | Status: DC
  Administered 2021-12-16 – 2021-12-21 (×6): 40 mg via INTRAVENOUS
  Filled 2021-12-16 (×6): qty 1

## 2021-12-16 MED ORDER — TOPIRAMATE 25 MG PO TABS
50.0000 mg | ORAL_TABLET | Freq: Every day | ORAL | Status: DC
  Administered 2021-12-16: 50 mg
  Filled 2021-12-16 (×2): qty 2

## 2021-12-16 MED ORDER — VITAL HIGH PROTEIN PO LIQD
1000.0000 mL | ORAL | Status: DC
  Administered 2021-12-16 – 2021-12-18 (×3): 1000 mL

## 2021-12-16 MED ORDER — PROSOURCE TF PO LIQD
45.0000 mL | Freq: Two times a day (BID) | ORAL | Status: DC
  Administered 2021-12-16 – 2021-12-18 (×5): 45 mL
  Filled 2021-12-16 (×4): qty 45

## 2021-12-16 MED ORDER — VALPROIC ACID 250 MG/5ML PO SOLN
500.0000 mg | Freq: Every evening | ORAL | Status: DC
  Administered 2021-12-16 – 2021-12-18 (×3): 500 mg
  Filled 2021-12-16 (×3): qty 10

## 2021-12-16 MED ORDER — TOPIRAMATE 25 MG PO TABS
25.0000 mg | ORAL_TABLET | Freq: Every day | ORAL | Status: DC
  Administered 2021-12-17: 25 mg
  Filled 2021-12-16: qty 1

## 2021-12-16 MED ORDER — VALPROIC ACID 250 MG/5ML PO SOLN
250.0000 mg | Freq: Every morning | ORAL | Status: DC
  Administered 2021-12-17 – 2021-12-18 (×2): 250 mg
  Filled 2021-12-16 (×2): qty 5

## 2021-12-16 NOTE — Progress Notes (Signed)
eLink Physician-Brief Progress Note Patient Name: Mary Avila DOB: 03/25/1951 MRN: [id number]   Date of Service  12/16/2021  HPI/Events of Note  Notified of patient going in and out of afib. On norepinephrine with slight increase in dose. Currently sinus. Receiving LR 150 cc/hr with good urine output. No significant electrolyte abnormality from AM labs.  eICU Interventions  Add vasopressin and titrate down norepinephrine to hopefully decrease episodes of arrhythmia Discussed with bedside RN     Intervention Category Intermediate Interventions: Arrhythmia - evaluation and management  Darl Pikes 12/16/2021, 11:04 PM

## 2021-12-16 NOTE — Procedures (Signed)
Bedside Bronchoscopy Procedure Note Mary Avila [id number] [birth year]  Procedure: Bronchoscopy Indications: Diagnostic evaluation of the airways and Obtain specimens for culture and/or other diagnostic studies  Procedure Details: ET Tube Size: ET Tube secured at lip (cm): Bite block in place: No In preparation for procedure, Patient hyper-oxygenated with 100 % FiO2 Airway entered and the following bronchi were examined: RUL.   Bronchoscope removed.    Evaluation BP 114/65    Pulse 97    Temp 98.8 F (37.1 C)    Resp (!) 23    Ht 5\' 3"  (1.6 m)    Wt 47.7 kg    SpO2 94%    BMI 18.63 kg/m  Breath Sounds:Diminished O2 sats: stable throughout Patient's Current Condition: stable Specimens:  Sent purulent fluid Complications: No apparent complications Patient did tolerate procedure well.   12/16/2021, 3:09 PM

## 2021-12-16 NOTE — Progress Notes (Incomplete)
Echocardiogram 2D Echocardiogram has been performed.  Eduard Roux 12/16/2021, 10:03 AM

## 2021-12-16 NOTE — Progress Notes (Signed)
NAME:  Mary Avila, MRN:  [id number], DOB:  06-07-1951, LOS: 1 ADMISSION DATE:  12/19/2021 CONSULTATION DATE:  01/06/2022 REFERRING MD:  Reather Converse - EDP CHIEF COMPLAINT:  Post-PEA arrest   History of Present Illness:  71 year old woman who presented to [facility] ED 1/9 via EMS after PEA arrest at facility.  Per report it appears patient had a prolonged respiratory arrest at facility with brief PEA cardiac arrest when EMS arrived. CPR performed x 8 minutes with Epi x 1 administered and ROSC.  Given GCS 3 on arrival patient was intubated for airway protection. CTA Chest was obtained to r/o PE; negative for PE but demonstrated significant air-fluid level with mild-moderate thick-walled cavitary lesion of the superior RUL with findings concerning for multifocal infection.  PCCM was consulted for admission.  Pertinent Medical History:   Past Medical History:  Diagnosis Date   Bipolar disorder ([place])    COPD (chronic obstructive pulmonary disease) ([place])    Osteopenia    Personality disorder ([place])    Schizoaffective disorder    Weakness    Significant Hospital Events: Including procedures, antibiotic start and stop dates in addition to other pertinent events   1/9 - OOH arrest at facility, LKN 1330. Found down 1430 with agonal respirations. EMS called and patient found to be in PEA. CPR x 8 mins, 1 Epi with ROSC. Epi gtt via R tibial IO for hypotension. GCS 3 on arrival. Intubated in ED. CTA without PE, +large RUL cavitary PNA superimposed on COPD. Empiric Zosyn/Vanc/Flagyl.  1/10 remains intubated this a.m. with no acute events overnight.  She will awaken to verbal stimuli and follow simple commands  Interim History / Subjective:  No acute events overnight T-max 102.2  Objective:  Blood pressure 110/71, pulse 98, temperature 99.3 F (37.4 C), resp. rate (!) 22, height 5\' 3"  (1.6 m), weight 47.7 kg, SpO2 95 %.    Vent Mode: PRVC FiO2 (%):  [51 %-100 %] 100 % Set Rate:  [16 bmp-28 bmp] 28  bmp Vt Set:  [410 mL] 410 mL PEEP:  [5 cmH20-10 cmH20] 8 cmH20 Plateau Pressure:  [14 cmH20-24 cmH20] 23 cmH20   Intake/Output Summary (Last 24 hours) at 12/16/2021 0711 Last data filed at 12/16/2021 0500 Gross per 24 hour  Intake 4680.47 ml  Output 610 ml  Net 4070.47 ml    Filed Weights   12/21/2021 1649 12/16/21 0359  Weight: 46.8 kg 47.7 kg   Physical Examination: General: Acute on chronic ill-appearing elderly female lying in bed on mechanical ventilation in no acute distress HEENT: ETT, MM pink/moist, PERRL,  Neuro: Will open eyes to verbal stimuli and follow simple commands CV: s1s2 regular rate and rhythm, no murmur, rubs, or gallops,  PULM: Bilateral rhonchi right greater than left, tolerating ventilator, no increased work of breathing, GI: soft, bowel sounds active in all 4 quadrants, non-tender, non-distended, tolerating TF Extremities: warm/dry, no edema  Skin: no rashes or lesions\  Resolved Hospital Problem List:    Assessment & Plan:  PEA arrest, out of hospital -Facility estimated 1 hour of downtime given last known normal was 1 hour prior to EMS being called. Required CPR x 8 min + Epi x 1 with ROSC -Suspected hypoxemic arrest, given PNA. GCS 3 on arrival. Undifferentiated shock -Septic vs. Cardiogenic in the setting of PNA/PEA arrest Lactic acidosis, improving  P: Continuous telemetry Continue pressors for map goal greater than 65 Trend lactic acid Monitor fever curve Continue broad-spectrum IV antibiotics Follow-up echocardiogram Trend troponins  RUL  cavitary pneumonia -CTA Chest negative for PE, demonstrating mild-moderate thick-walled cavitary lesion of RUL c/f multifocal pneumonia. No history of immunosuppressive medications. Acute-on-chronic hypoxic and hypercarbic respiratory failure COPD -Baseline COPD with moderate to high-grade centrilobular emphysema. P: Continue ventilator support with lung protective strategies  Wean PEEP and FiO2 for  sats greater than 90%. Head of bed elevated 30 degrees. Plateau pressures less than 30 cm H20.  Follow intermittent chest x-ray and ABG.   SAT/SBT as tolerated, mentation preclude extubation  Ensure adequate pulmonary hygiene  Follow cultures  VAP bundle in place  PAD protocol Continue antibiotics as above Discussed with attending need to evaluate AFBs/quant to [place] gold given right upper lobe cavitary lesion  AKI -Cr 2.04 on arrival, baseline 0.8-1 per previous labs (none recent) P: Follow renal function  Monitor urine output Trend Bmet Avoid nephrotoxins Ensure adequate renal perfusion  Gentle IV hydration provided on arrival  Schizoaffective disorder Bipolar disorder -Home medications include Cogentin, Ativan, Zyprexa, Topamax, valproic acid P: Resume home medications when appropriate Has legal guardian in place  At risk malnutrition Hypoalbuminemia P: Start tube feeds Supplement protein Dietitian consult  Best Practice: (right click and "Reselect all SmartList Selections" daily)   Diet/type: NPO, eventual TF DVT prophylaxis: SCD GI prophylaxis: PPI Lines: IO in place, R tibia Foley:  Yes, and it is still needed Code Status:  full code Last date of multidisciplinary goals of care discussion: Updated legal guardian daily    Critical care time:   Performed by: Deforrest Bogle D. Harris   Total critical care time: 40 minutes  Critical care time was exclusive of separately billable procedures and treating other patients.  Critical care was necessary to treat or prevent imminent or life-threatening deterioration.  Critical care was time spent personally by me on the following activities: development of treatment plan with patient and/or surrogate as well as nursing, discussions with consultants, evaluation of patient's response to treatment, examination of patient, obtaining history from patient or surrogate, ordering and performing treatments and interventions,  ordering and review of laboratory studies, ordering and review of radiographic studies, pulse oximetry and re-evaluation of patient's condition.  Larico Dimock D. Kenton Kingfisher, NP-C [facility] Personal contact information can be found on Amion  12/16/2021, 7:13 AM

## 2021-12-16 NOTE — Procedures (Signed)
Bronchoscopy Procedure Note  KEELIN NEVILLE  [id number]  18-Dec-1950  Date:12/16/21  Time:3:03 PM   Provider Performing:[place] Silveria P Carlis Abbott   Procedure(s):  Flexible bronchoscopy with bronchial alveolar lavage [id number])  Indication(s) Fibrocavitary disease of RUL, chronic cough  Consent Risks of the procedure as well as the alternatives and risks of each were explained to the patient and/or caregiver.  Consent for the procedure was obtained and is signed in the bedside chart- obtained by Guardian Gildardo Pounds.  Anesthesia Per MAR.   Time Out Verified patient identification, verified procedure, site/side was marked, verified correct patient position, special equipment/implants available, medications/allergies/relevant history reviewed, required imaging and test results available.   Sterile Technique Usual hand hygiene, masks, gowns, and gloves were used   Procedure Description Bronchoscope advanced through endotracheal tube and into airway.  Airways were examined down to subsegmental level with findings noted below.   Following diagnostic evaluation, BAL(s) performed in 20 with normal saline and return of 10cc fluid  Findings: Purulent return of fluid from RUL. Minimal thick secretions in other airways.   Complications/Tolerance None; patient tolerated the procedure well. Chest X-ray is not needed post procedure.   EBL Minimal   Specimen(s) BAL for fungus and AFB cultures.  Julian Hy, DO 12/16/21 3:05 PM [facility]

## 2021-12-16 NOTE — Progress Notes (Signed)
°  Transition of Care [facility]) Screening Note   Patient Details  Name: Mary Avila Date of Birth: [birth year]   Transition of Care [facility]) CM/SW Contact:    Milas Gain, [place] Phone Number: 12/16/2021, 5:28 PM    Transition of Care Department [facility]) has reviewed patient and no TOC needs have been identified at this time. We will continue to monitor patient advancement through interdisciplinary progression rounds. If new patient transition needs arise, please place a TOC consult.

## 2021-12-16 NOTE — Progress Notes (Signed)
Discussed her care with guardian Gildardo Pounds and via phone with another guardian in their agency, Lelon Frohlich, who is an Therapist, sports. They agree with proceeding with bronchoscopy with BAL to obtain cultures for fungal and AFB cultures. All questions were answered. Risks were discussed. Consent signed.   Julian Hy, DO 12/16/21 2:42 PM [place]

## 2021-12-17 DIAGNOSIS — Z20822 Contact with and (suspected) exposure to covid-19: Secondary | ICD-10-CM | POA: Diagnosis not present

## 2021-12-17 DIAGNOSIS — Z515 Encounter for palliative care: Secondary | ICD-10-CM | POA: Diagnosis not present

## 2021-12-17 DIAGNOSIS — I469 Cardiac arrest, cause unspecified: Secondary | ICD-10-CM | POA: Diagnosis not present

## 2021-12-17 DIAGNOSIS — Z9911 Dependence on respirator [ventilator] status: Secondary | ICD-10-CM | POA: Diagnosis not present

## 2021-12-17 DIAGNOSIS — Z66 Do not resuscitate: Secondary | ICD-10-CM | POA: Diagnosis not present

## 2021-12-17 DIAGNOSIS — A4101 Sepsis due to Methicillin susceptible Staphylococcus aureus: Secondary | ICD-10-CM | POA: Diagnosis not present

## 2021-12-17 LAB — BLOOD GAS, VENOUS
Acid-base deficit: 5.8 mmol/L — ABNORMAL HIGH (ref 0.0–2.0)
Bicarbonate: 21.1 mmol/L (ref 20.0–28.0)
O2 Saturation: 96.5 %
Patient temperature: 37
pCO2, Ven: 57.2 mmHg (ref 44.0–60.0)
pH, Ven: 7.192 — CL (ref 7.250–7.430)
pO2, Ven: 89.7 mmHg — ABNORMAL HIGH (ref 32.0–45.0)

## 2021-12-17 LAB — GLUCOSE, CAPILLARY
Glucose-Capillary: 137 mg/dL — ABNORMAL HIGH (ref 70–99)
Glucose-Capillary: 156 mg/dL — ABNORMAL HIGH (ref 70–99)
Glucose-Capillary: 170 mg/dL — ABNORMAL HIGH (ref 70–99)
Glucose-Capillary: 187 mg/dL — ABNORMAL HIGH (ref 70–99)
Glucose-Capillary: 205 mg/dL — ABNORMAL HIGH (ref 70–99)
Glucose-Capillary: 227 mg/dL — ABNORMAL HIGH (ref 70–99)
Glucose-Capillary: 229 mg/dL — ABNORMAL HIGH (ref 70–99)
Glucose-Capillary: 94 mg/dL (ref 70–99)

## 2021-12-17 LAB — POCT I-STAT 7, (LYTES, BLD GAS, ICA,H+H)
Acid-base deficit: 10 mmol/L — ABNORMAL HIGH (ref 0.0–2.0)
Bicarbonate: 16.1 mmol/L — ABNORMAL LOW (ref 20.0–28.0)
Calcium, Ion: 1.03 mmol/L — ABNORMAL LOW (ref 1.15–1.40)
HCT: 18 % — ABNORMAL LOW (ref 36.0–46.0)
Hemoglobin: 6.1 g/dL — CL (ref 12.0–15.0)
O2 Saturation: 99 %
Patient temperature: 98.4
Potassium: 4.1 mmol/L (ref 3.5–5.1)
Sodium: 138 mmol/L (ref 135–145)
TCO2: 17 mmol/L — ABNORMAL LOW (ref 22–32)
pCO2 arterial: 33.2 mmHg (ref 32.0–48.0)
pH, Arterial: 7.294 — ABNORMAL LOW (ref 7.350–7.450)
pO2, Arterial: 126 mmHg — ABNORMAL HIGH (ref 83.0–108.0)

## 2021-12-17 LAB — CBC
HCT: 26.7 % — ABNORMAL LOW (ref 36.0–46.0)
Hemoglobin: 8 g/dL — ABNORMAL LOW (ref 12.0–15.0)
MCH: 31.7 pg (ref 26.0–34.0)
MCHC: 30 g/dL (ref 30.0–36.0)
MCV: 106 fL — ABNORMAL HIGH (ref 80.0–100.0)
Platelets: 260 10*3/uL (ref 150–400)
RBC: 2.52 MIL/uL — ABNORMAL LOW (ref 3.87–5.11)
RDW: 14.6 % (ref 11.5–15.5)
WBC: 22.1 10*3/uL — ABNORMAL HIGH (ref 4.0–10.5)
nRBC: 0 % (ref 0.0–0.2)

## 2021-12-17 LAB — BASIC METABOLIC PANEL
Anion gap: 10 (ref 5–15)
BUN: 28 mg/dL — ABNORMAL HIGH (ref 8–23)
CO2: 18 mmol/L — ABNORMAL LOW (ref 22–32)
Calcium: 7.4 mg/dL — ABNORMAL LOW (ref 8.9–10.3)
Chloride: 106 mmol/L (ref 98–111)
Creatinine, Ser: 1.48 mg/dL — ABNORMAL HIGH (ref 0.44–1.00)
GFR, Estimated: 38 mL/min — ABNORMAL LOW (ref >60)
Glucose, Bld: 236 mg/dL — ABNORMAL HIGH (ref 70–99)
Potassium: 4.7 mmol/L (ref 3.5–5.1)
Sodium: 134 mmol/L — ABNORMAL LOW (ref 135–145)

## 2021-12-17 LAB — HEMOGLOBIN A1C
Hgb A1c MFr Bld: 6.5 % — ABNORMAL HIGH (ref 4.8–5.6)
Mean Plasma Glucose: 139.85 mg/dL

## 2021-12-17 LAB — MAGNESIUM
Magnesium: 1.8 mg/dL (ref 1.7–2.4)
Magnesium: 2.5 mg/dL — ABNORMAL HIGH (ref 1.7–2.4)

## 2021-12-17 LAB — BRAIN NATRIURETIC PEPTIDE: B Natriuretic Peptide: 413.5 pg/mL — ABNORMAL HIGH (ref 0.0–100.0)

## 2021-12-17 LAB — PHOSPHORUS
Phosphorus: 5 mg/dL — ABNORMAL HIGH (ref 2.5–4.6)
Phosphorus: 4.7 mg/dL — ABNORMAL HIGH (ref 2.5–4.6)

## 2021-12-17 LAB — HEPARIN LEVEL (UNFRACTIONATED): Heparin Unfractionated: <0.1 IU/mL — ABNORMAL LOW (ref 0.30–0.70)

## 2021-12-17 MED ORDER — MAGNESIUM SULFATE 2 GM/50ML IV SOLN
2.0000 g | Freq: Once | INTRAVENOUS | Status: AC
  Administered 2021-12-17: 2 g via INTRAVENOUS
  Filled 2021-12-17: qty 50

## 2021-12-17 MED ORDER — INSULIN GLARGINE-YFGN 100 UNIT/ML ~~LOC~~ SOLN
8.0000 [IU] | Freq: Every day | SUBCUTANEOUS | Status: DC
  Administered 2021-12-17 – 2021-12-22 (×6): 8 [IU] via SUBCUTANEOUS
  Filled 2021-12-17 (×7): qty 0.08

## 2021-12-17 MED ORDER — AMIODARONE HCL IN DEXTROSE 360-4.14 MG/200ML-% IV SOLN
30.0000 mg/h | INTRAVENOUS | Status: DC
  Administered 2021-12-17: 30 mg/h via INTRAVENOUS

## 2021-12-17 MED ORDER — AMIODARONE HCL IN DEXTROSE 360-4.14 MG/200ML-% IV SOLN
INTRAVENOUS | Status: AC
  Filled 2021-12-17: qty 200

## 2021-12-17 MED ORDER — AMIODARONE IV BOLUS ONLY 150 MG/100ML
150.0000 mg | Freq: Once | INTRAVENOUS | Status: AC
  Administered 2021-12-17: 150 mg via INTRAVENOUS

## 2021-12-17 MED ORDER — AMIODARONE LOAD VIA INFUSION
150.0000 mg | Freq: Once | INTRAVENOUS | Status: AC
  Administered 2021-12-17: 150 mg via INTRAVENOUS
  Filled 2021-12-17: qty 83.34

## 2021-12-17 MED ORDER — NOREPINEPHRINE 16 MG/250ML-% IV SOLN
0.0000 ug/min | INTRAVENOUS | Status: DC
  Administered 2021-12-17: 20 ug/min via INTRAVENOUS
  Administered 2021-12-18: 22 ug/min via INTRAVENOUS
  Administered 2021-12-19: 15 ug/min via INTRAVENOUS
  Administered 2021-12-19: 10 ug/min via INTRAVENOUS
  Administered 2021-12-21: 6 ug/min via INTRAVENOUS
  Filled 2021-12-17 (×6): qty 250

## 2021-12-17 MED ORDER — INSULIN ASPART 100 UNIT/ML IJ SOLN
0.0000 [IU] | INTRAMUSCULAR | Status: DC
  Administered 2021-12-17: 5 [IU] via SUBCUTANEOUS

## 2021-12-17 MED ORDER — AMIODARONE HCL IN DEXTROSE 360-4.14 MG/200ML-% IV SOLN
30.0000 mg/h | INTRAVENOUS | Status: DC
  Administered 2021-12-17 – 2021-12-18 (×4): 60 mg/h via INTRAVENOUS
  Administered 2021-12-18 – 2021-12-20 (×4): 30 mg/h via INTRAVENOUS
  Filled 2021-12-17 (×8): qty 200

## 2021-12-17 MED ORDER — FUROSEMIDE 10 MG/ML IJ SOLN
40.0000 mg | Freq: Once | INTRAMUSCULAR | Status: AC
  Administered 2021-12-17: 40 mg via INTRAVENOUS
  Filled 2021-12-17: qty 4

## 2021-12-17 MED ORDER — HEPARIN (PORCINE) 25000 UT/250ML-% IV SOLN
1400.0000 [IU]/h | INTRAVENOUS | Status: DC
  Administered 2021-12-17: 700 [IU]/h via INTRAVENOUS
  Administered 2021-12-18: 1000 [IU]/h via INTRAVENOUS
  Administered 2021-12-20: 04:00:00 1550 [IU]/h via INTRAVENOUS
  Administered 2021-12-20 – 2021-12-22 (×3): 1650 [IU]/h via INTRAVENOUS
  Filled 2021-12-17 (×8): qty 250

## 2021-12-17 MED ORDER — CALCIUM GLUCONATE-NACL 1-0.675 GM/50ML-% IV SOLN
1.0000 g | Freq: Once | INTRAVENOUS | Status: AC
  Administered 2021-12-17: 1000 mg via INTRAVENOUS
  Filled 2021-12-17: qty 50

## 2021-12-17 MED ORDER — AMIODARONE HCL IN DEXTROSE 360-4.14 MG/200ML-% IV SOLN
60.0000 mg/h | INTRAVENOUS | Status: DC
  Administered 2021-12-17: 60 mg/h via INTRAVENOUS
  Filled 2021-12-17: qty 200

## 2021-12-17 MED ORDER — INSULIN ASPART 100 UNIT/ML IJ SOLN
0.0000 [IU] | INTRAMUSCULAR | Status: DC
  Administered 2021-12-17: 3 [IU] via SUBCUTANEOUS
  Administered 2021-12-17 (×2): 4 [IU] via SUBCUTANEOUS
  Administered 2021-12-18: 7 [IU] via SUBCUTANEOUS
  Administered 2021-12-18 (×2): 3 [IU] via SUBCUTANEOUS
  Administered 2021-12-18 – 2021-12-19 (×4): 4 [IU] via SUBCUTANEOUS
  Administered 2021-12-19: 3 [IU] via SUBCUTANEOUS
  Administered 2021-12-19: 4 [IU] via SUBCUTANEOUS
  Administered 2021-12-20 (×2): 7 [IU] via SUBCUTANEOUS
  Administered 2021-12-20: 4 [IU] via SUBCUTANEOUS
  Administered 2021-12-20: 7 [IU] via SUBCUTANEOUS
  Administered 2021-12-20: 3 [IU] via SUBCUTANEOUS
  Administered 2021-12-20: 7 [IU] via SUBCUTANEOUS
  Administered 2021-12-21: 3 [IU] via SUBCUTANEOUS
  Administered 2021-12-21: 4 [IU] via SUBCUTANEOUS
  Administered 2021-12-21: 3 [IU] via SUBCUTANEOUS
  Administered 2021-12-21 – 2021-12-22 (×8): 4 [IU] via SUBCUTANEOUS
  Administered 2021-12-22: 7 [IU] via SUBCUTANEOUS
  Administered 2021-12-23 (×3): 4 [IU] via SUBCUTANEOUS

## 2021-12-17 NOTE — Progress Notes (Signed)
Initial Nutrition Assessment   INTERVENTION:   Tube feeding via OG: Change to Vital AF 1.2 at 55 ml/hr Provides 99 g of protein, 1584 kcals and 1069 mL of free water  NUTRITION DIAGNOSIS:   Inadequate oral intake related to acute illness as evidenced by NPO status.  Being addressed with   GOAL:   Patient will meet greater than or equal to 90% of their needs  Progressing  MONITOR:   Weight trends, TF tolerance, Labs, Vent status  REASON FOR ASSESSMENT:   Consult, Ventilator Enteral/tube feeding initiation and management  ASSESSMENT:   71 yo female admitted with acute respiratory failure with acute COPD exacerbation, multilobar pneumonia, septic shock due to staph pneumonia, AKI-improving. PMH includes DM, bipolar disorder with hallucinations  1/09 OOH arrest, Intubated 1/10 Bronch for BAL  Airborne precautions-ruling out TB  Pt remains on vent support, remains on levophed and vasopressin  Currently tolerating Vital High Protein at 40 ml/hr via OG  Abd xray from 1/09 recommending OG tube advancement by 6 cm. No repeat abd xray but per chest xrays, radiologist indicating tube appears to have been advanced. Recommend considering repeat abd xray for tube position  Labs: CBGs 99-161, sodium 135, phosphorus 5.0, CBGs 156-227 Meds: ss novolog, semglee, solumedrol, liquid MVI, solumedrol   NUTRITION - FOCUSED PHYSICAL EXAM:  Unable to assess-needs NFPE on follow-pt is high nutritional risk and suspect some degree of malnutrition  Diet Order:   Diet Order             Diet NPO time specified  Diet effective now                   EDUCATION NEEDS:   Not appropriate for education at this time  Skin:  Skin Assessment: Reviewed RN Assessment  Last BM:  PTA  Height:   Ht Readings from Last 1 Encounters:  12/21/2021 _0  (1.6 m)    Weight:   Wt Readings from Last 1 Encounters:  12/18/21 55.8 kg   BMI:  Body mass index is 21.79 kg/m.  Estimated  Nutritional Needs:   Kcal:  1500-1700 kcals  Protein:  75-95 g  Fluid:  >/= 1.5 L   Kerman Passey MS, RDN, LDN, CNSC Registered Dietitian III Clinical Nutrition RD Pager and On-Call Pager Number Located in [place]

## 2021-12-17 NOTE — Progress Notes (Signed)
NAME:  Mary Avila, MRN:  [id number], DOB:  1951/11/07, LOS: 2 ADMISSION DATE:  12/20/2021 CONSULTATION DATE:  12/25/2021 REFERRING MD:  Reather Converse - EDP CHIEF COMPLAINT:  Post-PEA arrest   History of Present Illness:  71 year old woman who presented to [facility] ED 1/9 via EMS after PEA arrest at facility.  Per report it appears patient had a prolonged respiratory arrest at facility with brief PEA cardiac arrest when EMS arrived. CPR performed x 8 minutes with Epi x 1 administered and ROSC.  Given GCS 3 on arrival patient was intubated for airway protection. CTA Chest was obtained to r/o PE; negative for PE but demonstrated significant air-fluid level with mild-moderate thick-walled cavitary lesion of the superior RUL with findings concerning for multifocal infection.  PCCM was consulted for admission.  Pertinent Medical History:   Past Medical History:  Diagnosis Date   Bipolar disorder ([place])    COPD (chronic obstructive pulmonary disease) ([place])    Osteopenia    Personality disorder ([place])    Schizoaffective disorder    Weakness    Significant Hospital Events: Including procedures, antibiotic start and stop dates in addition to other pertinent events   1/9 - OOH arrest at facility, LKN 1330. Found down 1430 with agonal respirations. EMS called and patient found to be in PEA. CPR x 8 mins, 1 Epi with ROSC. Epi gtt via R tibial IO for hypotension. GCS 3 on arrival. Intubated in ED. CTA without PE, +large RUL cavitary PNA superimposed on COPD. Empiric Zosyn/Vanc/Flagyl.  1/10 underwent bedside bronchoscopy for BAL 1/11 rapid A. fib with RVR overnight requiring amiodarone infusion  Interim History / Subjective:  Good urine output overnight of 2.4 L Appears slightly more encephalopathic this a.m.  Objective:  Blood pressure 96/72, pulse (!) 148, temperature 98.2 F (36.8 C), temperature source Axillary, resp. rate (!) 28, height _0  (1.6 m), weight 47.7 kg, SpO2 95 %.    Vent Mode:  PRVC FiO2 (%):  [60 %-100 %] 100 % Set Rate:  [28 bmp] 28 bmp Vt Set:  [410 mL] 410 mL PEEP:  [8 cmH20] 8 cmH20 Plateau Pressure:  [18 cmH20-22 cmH20] 18 cmH20   Intake/Output Summary (Last 24 hours) at 12/17/2021 0706 Last data filed at 12/17/2021 6283 Gross per 24 hour  Intake 3088.06 ml  Output 2415 ml  Net 673.06 ml    Filed Weights   01/03/2022 1649 12/16/21 0359  Weight: 46.8 kg 47.7 kg   Physical Examination: General: Acute on chronic ill-appearing elderly female lying in bed on mechanical ventilation in no acute distress HEENT: ETT, MM pink/moist, PERRL,  Neuro: Eyes spontaneously open with slight upper left gaze but eventually was able to track and follow simple commands CV: s1s2 regular rate and rhythm, no murmur, rubs, or gallops,  PULM rhonchi with diminished air entry bilaterally, tolerating ventilator, FiO2 100% GI: soft, bowel sounds active in all 4 quadrants, non-tender, non-distended Extremities: warm/dry, no edema  Skin: no rashes or lesions  Resolved Hospital Problem List:    Assessment & Plan:  PEA arrest, out of hospital -Facility estimated 1 hour of downtime given last known normal was 1 hour prior to EMS being called. Required CPR x 8 min + Epi x 1 with ROSC -Suspected hypoxemic arrest, given PNA. GCS 3 on arrival. Combined systolic and diastolic congestive heart failure -Echocardiogram 1/10 EF 50 to 55% with no wall motion abnormality and grade 1 diastolic dysfunction Lactic acidosis, improving  P: Continuous telemetry Increased pressor requirement overnight with  new onset A. fib Continue pressors for map goal greater than 65 Monitor fever curve Continue broad-spectrum IV antibiotics Monitor intake and output Daily weight  Septic shock -Patient presented initially meeting criteria for severe sepsis given tachycardia, tachypnea, fever, and elevated lactic acid with observed pneumonia -Initially concern for cardiogenic shock in addition to septic  given PEA arrest but picture appears most consistent with septic shock clinical P: Vent support as above Follow cultures Continue IV antibiotics Continue pressors for map goal greater than 65 Procalcitonin Monitor urine output Capillary refill:  RUL cavitary pneumonia -CTA Chest negative for PE, demonstrating mild-moderate thick-walled cavitary lesion of RUL c/f multifocal pneumonia. No history of immunosuppressive medications. Acute-on-chronic hypoxic and hypercarbic respiratory failure COPD -Baseline COPD with moderate to high-grade centrilobular emphysema. P: Continue ventilator support with lung protective strategies  Wean PEEP and FiO2 for sats greater than 90%. Head of bed elevated 30 degrees. Plateau pressures less than 30 cm H20.  Follow intermittent chest x-ray and ABG.   SAT/SBT as tolerated, mentation preclude extubation  Ensure adequate pulmonary hygiene  Follow cultures  VAP bundle in place  PAD protocol IV antibiotics Follow QuantiFERON gold Follow BAL Contact and airborne precautions given rule out TB  New onset A. fib RVR -Developed overnight of 1/10 P: Rate remains suboptimally controlled on IV amiodarone He was telemetry Check TSH   AKI -Cr 2.04 on arrival, baseline 0.8-1 per previous labs (none recent) P: Creatinine slightly improved Monitor urine output Follow renal function Trend BUN Avoid nephrotoxins Ensure adequate renal perfusion  Schizoaffective disorder Bipolar disorder -Home medications include Cogentin, Ativan, Zyprexa, Topamax, valproic acid P: Continue home Cogentin, Zyprexa, Topamax  At risk malnutrition Hypoalbuminemia P: Tube feeds Protein supplementation Dietary consult  Hyperglycemia P: SSI CBG goal 140-180 Check CBGs every 4 hours  Best Practice: (right click and "Reselect all SmartList Selections" daily)   Diet/type: NPO, eventual TF DVT prophylaxis: SCD GI prophylaxis: PPI Lines: IO in place, R  tibia Foley:  Yes, and it is still needed Code Status:  full code Last date of multidisciplinary goals of care discussion: Updated legal guardian daily    Critical care time:   Performed by: Surabhi Gadea D. Harris   Total critical care time: 38 minutes  Critical care time was exclusive of separately billable procedures and treating other patients.  Critical care was necessary to treat or prevent imminent or life-threatening deterioration.  Critical care was time spent personally by me on the following activities: development of treatment plan with patient and/or surrogate as well as nursing, discussions with consultants, evaluation of patient's response to treatment, examination of patient, obtaining history from patient or surrogate, ordering and performing treatments and interventions, ordering and review of laboratory studies, ordering and review of radiographic studies, pulse oximetry and re-evaluation of patient's condition.  Mardi Cannady D. Kenton Kingfisher, NP-C [facility] Personal contact information can be found on Amion  12/17/2021, 7:06 AM

## 2021-12-17 NOTE — Progress Notes (Addendum)
eLink Physician-Brief Progress Note Patient Name: Mary Avila DOB: 06/15/1951 MRN: [id number]   Date of Service  12/17/2021  HPI/Events of Note  More frequent episodes of afib RVR as high as 190s iCa 1.03 Able to come down on norepinephrine but not completely off  eICU Interventions  Ordered amiodarone 150 mg bolus Give calcium gluconate 1 gram Follow up on remaining electrolytes  Follow-up: Afib persistent despite amiodarone bolus K 4.7, Mg 1.8, phos 5 Will continue amiodarone infusion     Intervention Category Intermediate Interventions: Arrhythmia - evaluation and management  Darl Pikes 12/17/2021, 5:19 AM

## 2021-12-17 NOTE — Progress Notes (Signed)
Amiodarone Drug - Drug Interaction Consult Note  Recommendations: No significant drug interactions noted. Electrolytes replaced. Continue amiodarone infusion  Amiodarone is metabolized by the cytochrome P450 system and therefore has the potential to cause many drug interactions. Amiodarone has an average plasma half-life of 50 days (range 20 to 100 days).   There is potential for drug interactions to occur several weeks or months after stopping treatment and the onset of drug interactions may be slow after initiating amiodarone.   []  Statins: Increased risk of myopathy. Simvastatin- restrict dose to 20mg  daily. Other statins: counsel patients to report any muscle pain or weakness immediately.  []  Anticoagulants: Amiodarone can increase anticoagulant effect. Consider warfarin dose reduction. Patients should be monitored closely and the dose of anticoagulant altered accordingly, remembering that amiodarone levels take several weeks to stabilize.  []  Antiepileptics: Amiodarone can increase plasma concentration of phenytoin, the dose should be reduced. Note that small changes in phenytoin dose can result in large changes in levels. Monitor patient and counsel on signs of toxicity.  []  Beta blockers: increased risk of bradycardia, AV block and myocardial depression. Sotalol - avoid concomitant use.  []   Calcium channel blockers (diltiazem and verapamil): increased risk of bradycardia, AV block and myocardial depression.  []   Cyclosporine: Amiodarone increases levels of cyclosporine. Reduced dose of cyclosporine is recommended.  []  Digoxin dose should be halved when amiodarone is started.  []  Diuretics: increased risk of cardiotoxicity if hypokalemia occurs.  []  Oral hypoglycemic agents (glyburide, glipizide, glimepiride): increased risk of hypoglycemia. Patient's glucose levels should be monitored closely when initiating amiodarone therapy.   []  Drugs that prolong the QT interval:  Torsades de  pointes risk may be increased with concurrent use - avoid if possible.  Monitor QTc, also keep magnesium/potassium WNL if concurrent therapy can't be avoided.  Antibiotics: e.g. fluoroquinolones, erythromycin.  Antiarrhythmics: e.g. quinidine, procainamide, disopyramide, sotalol.  Antipsychotics: e.g. phenothiazines, haloperidol.   Lithium, tricyclic antidepressants, and methadone.  Thank You,   Albertina Parr, PharmD., BCPS, BCCCP Clinical Pharmacist Please refer to [facility] for unit-specific pharmacist

## 2021-12-17 NOTE — Progress Notes (Signed)
[facility] ADULT ICU REPLACEMENT PROTOCOL   The patient does apply for the [facility] Adult ICU Electrolyte Replacment Protocol based on the criteria listed below:   1.Exclusion criteria: TCTS patients, ECMO patients, and Dialysis patients 2. Is GFR >/= 30 ml/min? Yes.    Patient's GFR today is 38 3. Is SCr </= 2? Yes.   Patient's SCr is 1.48 mg/dL 4. Did SCr increase >/= 0.5 in 24 hours? No. 5.Pt's weight >40kg  Yes.   6. Abnormal electrolyte(s): mag 1.8  7. Electrolytes replaced per protocol 8.  Call MD STAT for K+ </= 2.5, Phos </= 1, or Mag </= 1 Physician:  n/a  Melvern Banker 12/17/2021 5:29 AM

## 2021-12-17 NOTE — Procedures (Signed)
Arterial Catheter Insertion Procedure Note  SYESHA THAW  [id number]  [birth year]  Date:12/17/21  Time:5:49 AM    Provider Performing: Inez Pilgrim    Procedure: Insertion of Arterial Line ([id number]) without US guidance  Indication(s) Blood pressure monitoring and/or need for frequent ABGs  Consent Unable to obtain consent due to emergent nature of procedure.  Anesthesia None   Time Out Verified patient identification, verified procedure, site/side was marked, verified correct patient position, special equipment/implants available, medications/allergies/relevant history reviewed, required imaging and test results available.   Sterile Technique Maximal sterile technique including full sterile barrier drape, hand hygiene, sterile gown, sterile gloves, mask, hair covering, sterile ultrasound probe cover (if used).   Procedure Description Area of catheter insertion was cleaned with chlorhexidine and draped in sterile fashion. Without real-time ultrasound guidance an arterial catheter was placed into the left radial artery.  Appropriate arterial tracings confirmed on monitor.     Complications/Tolerance None; patient tolerated the procedure well.   EBL Minimal   Specimen(s) None

## 2021-12-17 NOTE — Progress Notes (Signed)
ANTICOAGULATION CONSULT NOTE - Initial Consult  Pharmacy Consult for Heparin Indication: atrial fibrillation  No Known Allergies  Patient Measurements: Height: _0  (160 cm) Weight: 47.7 kg (105 lb 2.6 oz) IBW/kg (Calculated) : 52.4 Heparin Dosing Weight: 47.7 kg  Vital Signs: Temp: 98.7 F (37.1 C) (01/11 0800) Temp Source: Axillary (01/11 0800) BP: 105/72 (01/11 0945) Pulse Rate: 119 (01/11 0945)  Labs: Recent Labs    12/20/2021 1607 12/27/2021 1632 12/21/2021 1633 01/03/2022 1810 01/06/2022 2046 12/16/21 0355 12/16/21 0357 12/17/21 0423 12/17/21 0456  HGB 7.8* 8.8*   < >  --    < > 8.2* 9.2* 8.0* 6.1*  HCT 27.1* 26.0*   < >  --    < > 28.5* 27.0* 26.7* 18.0*  PLT 388  --   --   --   --  327  --  260  --   APTT 35  --   --   --   --   --   --   --   --   LABPROT 17.3*  --   --   --   --   --   --   --   --   INR 1.4*  --   --   --   --   --   --   --   --   CREATININE 2.04* 1.80*  --   --   --  1.79*  --  1.48*  --   TROPONINIHS 625*  --   --  549*  --   --   --   --   --    < > = values in this interval not displayed.    Estimated Creatinine Clearance: 26.6 mL/min (A) (by C-G formula based on SCr of 1.48 mg/dL (H)).   Medical History: Past Medical History:  Diagnosis Date   Bipolar disorder ([place])    COPD (chronic obstructive pulmonary disease) ([place])    Osteopenia    Personality disorder (HCC)    Schizoaffective disorder    Weakness     Medications:  Scheduled:   arformoterol  15 mcg Nebulization BID   benztropine  0.5 mg Per Tube Daily   chlorhexidine gluconate (MEDLINE KIT)  15 mL Mouth Rinse BID   Chlorhexidine Gluconate Cloth  6 each Topical Daily   feeding supplement (PROSource TF)  45 mL Per Tube BID   feeding supplement (VITAL HIGH PROTEIN)  1,000 mL Per Tube Q24H   heparin  5,000 Units Subcutaneous Q8H   insulin aspart  0-20 Units Subcutaneous Q4H   ipratropium-albuterol  3 mL Nebulization Q4H   mouth rinse  15 mL Mouth Rinse 10 times per day    methylPREDNISolone (SOLU-MEDROL) injection  40 mg Intravenous Q24H   multivitamin  15 mL Per Tube Daily   OLANZapine  10 mg Per Tube Daily   pantoprazole (PROTONIX) IV  40 mg Intravenous QHS   sodium chloride flush  10-40 mL Intracatheter Q12H   topiramate  25 mg Per Tube Daily   topiramate  50 mg Per Tube QHS   valproic acid  250 mg Per Tube q morning   valproic acid  500 mg Per Tube QPM   Infusions:   amiodarone 60 mg/hr (12/17/21 0816)   Followed by   amiodarone     ceFEPime (MAXIPIME) IV Stopped (12/16/21 1623)   fentaNYL infusion INTRAVENOUS 50 mcg/hr (12/17/21 0900)   norepinephrine (LEVOPHED) Adult infusion 30 mcg/min (12/17/21 1015)   vasopressin 0.03 Units/min (  12/17/21 0900)    Assessment: 83 YOF who presented to [facility] ED 1/9 via EMS after PEA arrest at facility. Patient intubated for airway protection. Suspected hypoxemic arrest given PNA. Overnight, patient developed new onset Afib requiring anticoagulation. No bolus. PLTs 260, Hgb 8 - stable. No anticoagulation PTA.  Goal of Therapy:  Heparin level 0.3-0.7 units/ml Monitor platelets by anticoagulation protocol: Yes   Plan:  Start heparin infusion at 700 units/hr Check HL and CBC in 8 hours Monitor for signs and symptoms of bleeding  Debria Garret, Student Pharmacist 12/17/2021,11:59 AM

## 2021-12-18 DIAGNOSIS — Z515 Encounter for palliative care: Secondary | ICD-10-CM | POA: Diagnosis not present

## 2021-12-18 DIAGNOSIS — I469 Cardiac arrest, cause unspecified: Secondary | ICD-10-CM | POA: Diagnosis not present

## 2021-12-18 DIAGNOSIS — Z20822 Contact with and (suspected) exposure to covid-19: Secondary | ICD-10-CM | POA: Diagnosis not present

## 2021-12-18 DIAGNOSIS — Z9911 Dependence on respirator [ventilator] status: Secondary | ICD-10-CM | POA: Diagnosis not present

## 2021-12-18 DIAGNOSIS — Z66 Do not resuscitate: Secondary | ICD-10-CM | POA: Diagnosis not present

## 2021-12-18 DIAGNOSIS — A4101 Sepsis due to Methicillin susceptible Staphylococcus aureus: Secondary | ICD-10-CM | POA: Diagnosis not present

## 2021-12-18 LAB — CULTURE, RESPIRATORY W GRAM STAIN

## 2021-12-18 LAB — BASIC METABOLIC PANEL
Anion gap: 12 (ref 5–15)
BUN: 32 mg/dL — ABNORMAL HIGH (ref 8–23)
CO2: 22 mmol/L (ref 22–32)
Calcium: 8 mg/dL — ABNORMAL LOW (ref 8.9–10.3)
Chloride: 100 mmol/L (ref 98–111)
Creatinine, Ser: 1.77 mg/dL — ABNORMAL HIGH (ref 0.44–1.00)
GFR, Estimated: 31 mL/min — ABNORMAL LOW (ref >60)
Glucose, Bld: 145 mg/dL — ABNORMAL HIGH (ref 70–99)
Potassium: 4 mmol/L (ref 3.5–5.1)
Sodium: 134 mmol/L — ABNORMAL LOW (ref 135–145)

## 2021-12-18 LAB — POCT I-STAT 7, (LYTES, BLD GAS, ICA,H+H)
Acid-base deficit: 6 mmol/L — ABNORMAL HIGH (ref 0.0–2.0)
Acid-base deficit: 3 mmol/L — ABNORMAL HIGH (ref 0.0–2.0)
Bicarbonate: 21.5 mmol/L (ref 20.0–28.0)
Bicarbonate: 23.4 mmol/L (ref 20.0–28.0)
Calcium, Ion: 1.18 mmol/L (ref 1.15–1.40)
Calcium, Ion: 1.21 mmol/L (ref 1.15–1.40)
HCT: 23 % — ABNORMAL LOW (ref 36.0–46.0)
HCT: 23 % — ABNORMAL LOW (ref 36.0–46.0)
Hemoglobin: 7.8 g/dL — ABNORMAL LOW (ref 12.0–15.0)
Hemoglobin: 7.8 g/dL — ABNORMAL LOW (ref 12.0–15.0)
O2 Saturation: 87 %
O2 Saturation: 89 %
Patient temperature: 37.4
Patient temperature: 37.4
Potassium: 3.6 mmol/L (ref 3.5–5.1)
Potassium: 3.4 mmol/L — ABNORMAL LOW (ref 3.5–5.1)
Sodium: 135 mmol/L (ref 135–145)
Sodium: 134 mmol/L — ABNORMAL LOW (ref 135–145)
TCO2: 23 mmol/L (ref 22–32)
TCO2: 25 mmol/L (ref 22–32)
pCO2 arterial: 51.9 mmHg — ABNORMAL HIGH (ref 32.0–48.0)
pCO2 arterial: 51.7 mmHg — ABNORMAL HIGH (ref 32.0–48.0)
pH, Arterial: 7.227 — ABNORMAL LOW (ref 7.350–7.450)
pH, Arterial: 7.266 — ABNORMAL LOW (ref 7.350–7.450)
pO2, Arterial: 65 mmHg — ABNORMAL LOW (ref 83.0–108.0)
pO2, Arterial: 68 mmHg — ABNORMAL LOW (ref 83.0–108.0)

## 2021-12-18 LAB — QUANTIFERON-TB GOLD PLUS (RQFGPL)
QuantiFERON Mitogen Value: 0.21 IU/mL
QuantiFERON Nil Value: 0.09 IU/mL
QuantiFERON TB1 Ag Value: 0.14 IU/mL
QuantiFERON TB2 Ag Value: 0.11 IU/mL

## 2021-12-18 LAB — CBC
HCT: 25.3 % — ABNORMAL LOW (ref 36.0–46.0)
Hemoglobin: 8 g/dL — ABNORMAL LOW (ref 12.0–15.0)
MCH: 32.3 pg (ref 26.0–34.0)
MCHC: 31.6 g/dL (ref 30.0–36.0)
MCV: 102 fL — ABNORMAL HIGH (ref 80.0–100.0)
Platelets: 219 10*3/uL (ref 150–400)
RBC: 2.48 MIL/uL — ABNORMAL LOW (ref 3.87–5.11)
RDW: 14.6 % (ref 11.5–15.5)
WBC: 22.4 10*3/uL — ABNORMAL HIGH (ref 4.0–10.5)
nRBC: 0.1 % (ref 0.0–0.2)

## 2021-12-18 LAB — GLUCOSE, CAPILLARY
Glucose-Capillary: 129 mg/dL — ABNORMAL HIGH (ref 70–99)
Glucose-Capillary: 154 mg/dL — ABNORMAL HIGH (ref 70–99)
Glucose-Capillary: 161 mg/dL — ABNORMAL HIGH (ref 70–99)
Glucose-Capillary: 133 mg/dL — ABNORMAL HIGH (ref 70–99)
Glucose-Capillary: 112 mg/dL — ABNORMAL HIGH (ref 70–99)
Glucose-Capillary: 201 mg/dL — ABNORMAL HIGH (ref 70–99)

## 2021-12-18 LAB — ACID FAST SMEAR (AFB, MYCOBACTERIA): Acid Fast Smear: NEGATIVE

## 2021-12-18 LAB — TSH: TSH: 1.518 u[IU]/mL (ref 0.350–4.500)

## 2021-12-18 LAB — QUANTIFERON-TB GOLD PLUS: QuantiFERON-TB Gold Plus: UNDETERMINED — AB

## 2021-12-18 LAB — HEPARIN LEVEL (UNFRACTIONATED)
Heparin Unfractionated: <0.1 IU/mL — ABNORMAL LOW (ref 0.30–0.70)
Heparin Unfractionated: <0.1 IU/mL — ABNORMAL LOW (ref 0.30–0.70)

## 2021-12-18 MED ORDER — FUROSEMIDE 10 MG/ML IJ SOLN
40.0000 mg | Freq: Two times a day (BID) | INTRAMUSCULAR | Status: AC
  Administered 2021-12-18 (×2): 40 mg via INTRAVENOUS
  Filled 2021-12-18 (×2): qty 4

## 2021-12-18 MED ORDER — SENNOSIDES 8.8 MG/5ML PO SYRP
5.0000 mL | ORAL_SOLUTION | Freq: Two times a day (BID) | ORAL | Status: DC
  Administered 2021-12-18 – 2021-12-23 (×11): 5 mL
  Filled 2021-12-18 (×11): qty 5

## 2021-12-18 MED ORDER — POTASSIUM CHLORIDE 20 MEQ PO PACK
20.0000 meq | PACK | Freq: Once | ORAL | Status: AC
  Administered 2021-12-18: 20 meq
  Filled 2021-12-18: qty 1

## 2021-12-18 MED ORDER — VITAL AF 1.2 CAL PO LIQD
1000.0000 mL | ORAL | Status: DC
  Administered 2021-12-18 – 2021-12-22 (×3): 1000 mL

## 2021-12-18 MED ORDER — POLYETHYLENE GLYCOL 3350 17 G PO PACK
17.0000 g | PACK | Freq: Every day | ORAL | Status: DC
  Administered 2021-12-18 – 2021-12-23 (×6): 17 g
  Filled 2021-12-18 (×6): qty 1

## 2021-12-18 MED ORDER — CEFAZOLIN SODIUM-DEXTROSE 2-4 GM/100ML-% IV SOLN
2.0000 g | Freq: Two times a day (BID) | INTRAVENOUS | Status: DC
  Administered 2021-12-18 – 2021-12-23 (×11): 2 g via INTRAVENOUS
  Filled 2021-12-18 (×11): qty 100

## 2021-12-18 NOTE — TOC Initial Note (Signed)
Transition of Care [facility]) - Initial/Assessment Note    Patient Details  Name: Mary Avila MRN: [id number] Date of Birth: [birth year]  [facility]) CM/SW Contact:    Milas Gain, [place] Phone Number: 12/18/2021, 3:43 PM  Clinical Narrative:                   Patient not currently oriented. CSW spoke with patients legal guardian Verdis Frederickson. Verdis Frederickson confirmed patient comes from [facility]. Verdis Frederickson confirmed plan when patient is medically ready for dc is to return to [facility]. CSW LVM for [facility] at [facility]. CSW awaiting callback.CSW will continue to follow and assist with patients dc planning needs.  Expected Discharge Plan: Assisted Living (from [facility]) Barriers to Discharge: Continued Medical Work up   Patient Goals and CMS Choice   CMS Medicare.gov Compare Post Acute Care list provided to:: Patient Represenative (must comment) (Legal Guardian)    Expected Discharge Plan and Services Expected Discharge Plan: Assisted Living (from [facility]) In-house Referral: Clinical Social Work     Living arrangements for the past 2 months: [place]                                      Prior Living Arrangements/Services Living arrangements for the past 2 months: [place] Lives with:: Facility Resident Patient language and need for interpreter reviewed:: Yes Do you feel safe going back to the place where you live?: Yes      Need for Family Participation in Patient Care: Yes (Comment) Care giver support system in place?: Yes (comment)   Criminal Activity/Legal Involvement Pertinent to Current Situation/Hospitalization: No - Comment as needed  Activities of Daily Living      Permission Sought/Granted Permission sought to share information with : Case Manager, Family Supports, Customer service manager Permission granted to share information with : No  Share Information with NAME: patient not currently  oriented spoke with patients legal guardian Gildardo Pounds  Permission granted to share info w AGENCY: patient not currently oriented spoke with patients legal guardian Verdis Frederickson [name]/ALF  Permission granted to share info w Relationship: patient not currently oriented spoke with patients legal guardian Gildardo Pounds  Permission granted to share info w Contact Information: patient not currently oriented spoke with patients legal guardian Gildardo Pounds [phone]  Emotional Assessment       Orientation: :  (intubated/trach) Alcohol / Substance Use: Not Applicable Psych Involvement: No (comment)  Admission diagnosis:  Cardiac arrest ([place]) [I46.9] Septic shock ([place]) [A41.9, R65.21] Patient Active Problem List   Diagnosis Date Noted   Cardiac arrest ([place]) 12/13/2021   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

## 2021-12-18 NOTE — Progress Notes (Signed)
ANTICOAGULATION CONSULT NOTE  Pharmacy Consult for Heparin Indication: atrial fibrillation  No Known Allergies  Patient Measurements: Height: 5\' 3"  (160 cm) Weight: 55.8 kg (123 lb 0.3 oz) IBW/kg (Calculated) : 52.4 Heparin Dosing Weight: 47.7 kg  Vital Signs: Temp: 99 F (37.2 C) (01/12 0600) Temp Source: Esophageal (01/12 0400) BP: 98/57 (01/12 0815) Pulse Rate: 72 (01/12 0815)  Labs: Recent Labs    12/13/2021 1607 12/13/2021 1632 12/31/2021 1810 12/16/2021 2046 12/16/21 0355 12/16/21 0357 12/17/21 0423 12/17/21 0456 12/17/21 2241 12/18/21 0348 12/18/21 0846  HGB 7.8*   < >  --    < > 8.2*   < > 8.0* 6.1*  --  8.0*  --   HCT 27.1*   < >  --    < > 28.5*   < > 26.7* 18.0*  --  25.3*  --   PLT 388  --   --   --  327  --  260  --   --  219  --   APTT 35  --   --   --   --   --   --   --   --   --   --   LABPROT 17.3*  --   --   --   --   --   --   --   --   --   --   INR 1.4*  --   --   --   --   --   --   --   --   --   --   HEPARINUNFRC  --   --   --   --   --   --   --   --  <0.10*  --  <0.10*  CREATININE 2.04*   < >  --   --  1.79*  --  1.48*  --   --  1.77*  --   TROPONINIHS 625*  --  549*  --   --   --   --   --   --   --   --    < > = values in this interval not displayed.     Estimated Creatinine Clearance: 24.5 mL/min (A) (by C-G formula based on SCr of 1.77 mg/dL (H)).   Medical History: Past Medical History:  Diagnosis Date   Bipolar disorder ([place])    COPD (chronic obstructive pulmonary disease) ([place])    Osteopenia    Personality disorder (HCC)    Schizoaffective disorder    Weakness     Medications:  Scheduled:   arformoterol  15 mcg Nebulization BID   benztropine  0.5 mg Per Tube Daily   chlorhexidine gluconate (MEDLINE KIT)  15 mL Mouth Rinse BID   Chlorhexidine Gluconate Cloth  6 each Topical Daily   feeding supplement (PROSource TF)  45 mL Per Tube BID   feeding supplement (VITAL HIGH PROTEIN)  1,000 mL Per Tube Q24H   insulin aspart  0-20  Units Subcutaneous Q4H   insulin glargine-yfgn  8 Units Subcutaneous QHS   ipratropium-albuterol  3 mL Nebulization Q4H   mouth rinse  15 mL Mouth Rinse 10 times per day   methylPREDNISolone (SOLU-MEDROL) injection  40 mg Intravenous Q24H   multivitamin  15 mL Per Tube Daily   OLANZapine  10 mg Per Tube Daily   pantoprazole (PROTONIX) IV  40 mg Intravenous QHS   sodium chloride flush  10-40 mL Intracatheter Q12H   valproic  acid  250 mg Per Tube q morning   valproic acid  500 mg Per Tube QPM   Infusions:   amiodarone 60 mg/hr (12/18/21 0731)   ceFEPime (MAXIPIME) IV Stopped (12/17/21 1656)   fentaNYL infusion INTRAVENOUS 100 mcg/hr (12/18/21 0600)   heparin 850 Units/hr (12/18/21 0600)   norepinephrine (LEVOPHED) Adult infusion 22 mcg/min (12/18/21 0600)   vasopressin 0.04 Units/min (12/18/21 0600)    Assessment: 16 YOF who has developed new onset Afib - no AC PTA.   Heparin level is undetectable, on 850 units/hr. Hgb 8, plt 219. No s/sx of bleeding or infusion issues.   Goal of Therapy:  Heparin level 0.3-0.7 units/ml Monitor platelets by anticoagulation protocol: Yes   Plan:  Increase heparin infusion to 1000 units/hr Order heparin level in 8 hours Monitor daily HL, CBC, and for s/sx of bleeding   Antonietta Jewel, PharmD, [place] Pharmacist  Phone: [phone] 12/18/2021 9:36 AM  Please check AMION for all [place] phone numbers After 10:00 PM, call [place] [phone]

## 2021-12-18 NOTE — Progress Notes (Addendum)
NAME:  Mary Avila, MRN:  [id number], DOB:  1951-02-27, LOS: 3 ADMISSION DATE:  12/14/2021 CONSULTATION DATE:  12/26/2021 REFERRING MD:  Reather Converse - EDP CHIEF COMPLAINT:  Post-PEA arrest   History of Present Illness:  71 year old woman who presented to [facility] ED 1/9 via EMS after PEA arrest at facility.  Per report it appears patient had a prolonged respiratory arrest at facility with brief PEA cardiac arrest when EMS arrived. CPR performed x 8 minutes with Epi x 1 administered and ROSC.  Given GCS 3 on arrival patient was intubated for airway protection. CTA Chest was obtained to r/o PE; negative for PE but demonstrated significant air-fluid level with mild-moderate thick-walled cavitary lesion of the superior RUL with findings concerning for multifocal infection.  PCCM was consulted for admission.  Pertinent Medical History:   Past Medical History:  Diagnosis Date   Bipolar disorder ([place])    COPD (chronic obstructive pulmonary disease) ([place])    Osteopenia    Personality disorder ([place])    Schizoaffective disorder    Weakness    Significant Hospital Events: Including procedures, antibiotic start and stop dates in addition to other pertinent events   1/9 - OOH arrest at facility, LKN 1330. Found down 1430 with agonal respirations. EMS called and patient found to be in PEA. CPR x 8 mins, 1 Epi with ROSC. Epi gtt via R tibial IO for hypotension. GCS 3 on arrival. Intubated in ED. CTA without PE, +large RUL cavitary PNA superimposed on COPD. Empiric Zosyn/Vanc/Flagyl.  1/10 underwent bedside bronchoscopy for BAL 1/11 rapid A. fib with RVR overnight requiring amiodarone infusion 1/12 weaning amio to 30 -- back in NSR. Weaning pressors.   Interim History / Subjective:   Arterial line very positional, when reading accurately, correlates with NIBP  On 25 NE and 0.04 vaso  Cr incr  today to 1.77 from 1.48  Objective:  Blood pressure 98/64, pulse 68, temperature 98.8 F (37.1 C), resp. rate  (!) 28, height _0  (1.6 m), weight 55.8 kg, SpO2 (!) 85 %.    Vent Mode: PRVC FiO2 (%):  [50 %-70 %] 50 % Set Rate:  [28 bmp] 28 bmp Vt Set:  [410 mL] 410 mL PEEP:  [8 cmH20] 8 cmH20 Plateau Pressure:  [19 cmH20-21 cmH20] 21 cmH20   Intake/Output Summary (Last 24 hours) at 12/18/2021 1121 Last data filed at 12/18/2021 0954 Gross per 24 hour  Intake 3503.54 ml  Output 2225 ml  Net 1278.54 ml   Filed Weights   12/20/2021 1649 12/16/21 0359 12/18/21 0600  Weight: 46.8 kg 47.7 kg 55.8 kg   Physical Examination: General: Critically and chronically ill appearing older adult F, intubated lightly sedated  HEENT: NCAT ETT secure anicteric sclera  Neuro: lightly sedated, opens eyes spontaneously. Makes eyecontact. Does not follow commands. Moving BUE BLE spontaneously  CV: RRR s1s2 cap refill < 3  PULM: Mechanically ventilated. Rhonchi. Symmetrical chest expansion  GI: soft ndnt + bowel sounds  Extremities: No acute deformity no cyanosis or clubbing  Skin: c/d/w no rash  Resolved Hospital Problem List:    Assessment & Plan:     Acute on chronic respiratory failure with hypoxia and hypercarbia RUL Cavitary PNA MSSA PNA COPD  -CTA Chest negative for PE, demonstrating mild-moderate thick-walled cavitary lesion of RUL c/f multifocal pneumonia. No history of immunosuppressive medications. -Baseline COPD with moderate to high-grade centrilobular emphysema. P: -cont airborne precautions -- r/o TB -ancef for MSSA -wean MV as able  -brovana, duoneb, solumedrol  -  check ABG 1/12 and PRN -AM CXR  OOH PEA Arrest -total downtime unclear. CPR x 8 min -Suspected hypoxemic arrest, given PNA. GCS 3 on arrival. -Echocardiogram 1/10 EF 50 to 55% with no wall motion abnormality and grade 1 diastolic dysfunction P: -Supportive care  -follow neuro exam   Septic shock due to MSSA PNA  -initial concern for possible cardiogenic component to shock, but most c/w septic shock  P: -ancef  -On NE  and vaso -- MAP goal > 65. Wean NE first, when NE at 54, decr vaso from 0.04 to 0.03, then vaso off.   New onset Afib, RVR -- now NSR Combined systolic and diastolic HF P: -decr amio to 30 from 60  -hopefully dc amio later today  -hep gtt per pharmacist dosing -K > 4 Mag > 2  -BID lasix   AKI -Cr 2.04 on arrival, baseline 0.8-1 per previous labs P: -trend renal indices UOP   Acute metabolic encephalopathy superimposed on underlying psychiatric conditions: Schizoaffective disorder Bipolar disorder  -Home medications include Cogentin, Ativan, Zyprexa, Topamax, valproic acid -acute encephalopathy 2/2 meds, cardiac arrest, shock, sepsis  P: -cont zyprexa, VPA, cogentin  -hold topiramate 2/2 metabolic acidosis  -wean acutely sedating meds as able   At risk malnutrition P: -EN per RDN   Hyperglycemia  P: -SSI   Best Practice: (right click and "Reselect all SmartList Selections" daily)   Diet/type: EN DVT prophylaxis: SCD, heparin infusion  GI prophylaxis: PPI Lines: CVC, arterial line  Foley:  Yes, and it is still needed Code Status:  full code Last date of multidisciplinary goals of care discussion: Legal guardian has received daily updates. Pending 1/12     Critical care time:    CRITICAL CARE Performed by: Cristal Generous  Total critical care time: 40 minutes  Critical care time was exclusive of separately billable procedures and treating other patients. Critical care was necessary to treat or prevent imminent or life-threatening deterioration.  Critical care was time spent personally by me on the following activities: development of treatment plan with patient and/or surrogate as well as nursing, discussions with consultants, evaluation of patient's response to treatment, examination of patient, obtaining history from patient or surrogate, ordering and performing treatments and interventions, ordering and review of laboratory studies, ordering and review of  radiographic studies, pulse oximetry and re-evaluation of patient's condition.   Eliseo Gum MSN, AGACNP-BC [place] for pager  12/18/2021, 11:21 AM

## 2021-12-18 NOTE — Progress Notes (Signed)
[facility] Progress Note Patient Name: Mary Avila DOB: [birth year] MRN: [id number]   Date of Service  12/18/2021  HPI/Events of Note  Patient is back in atrial fibrillation, heart rate is 95 - 125, she is on Amiodarone  gtt at 30 mg // hour. She is also on Heparin gtt.  eICU Interventions  Continue Heparin and Amiodarone gtt and monitor rhythm and ventricular  response rate, will consider Amiodarone bolus for persistent RVR.        Kerry Kass Tracina Beaumont 12/18/2021, 10:50 PM

## 2021-12-18 NOTE — Plan of Care (Signed)

## 2021-12-18 NOTE — Progress Notes (Signed)
Pt went into a-fib around 2043 w/HR in 95 - 125s.... Pt is already on Hep gtt at 11.5 and Amio at 30.... Notified [place] and spoke w/June Therapist, sports.

## 2021-12-18 NOTE — Progress Notes (Signed)
ANTICOAGULATION CONSULT NOTE Pharmacy Consult for Heparin Indication: atrial fibrillation Brief A/P: Heparin level subtherapeutic Increase Heparin rate  No Known Allergies  Patient Measurements: Height: 5\' 3"  (160 cm) Weight: 47.7 kg (105 lb 2.6 oz) IBW/kg (Calculated) : 52.4 Heparin Dosing Weight: 47.7 kg  Vital Signs: Temp: 98.1 F (36.7 C) (01/11 2200) Temp Source: Esophageal (01/11 2000) BP: 91/75 (01/11 2300) Pulse Rate: 69 (01/11 2300)  Labs: Recent Labs    12/16/2021 1607 12/25/2021 1632 12/14/2021 1633 12/19/2021 1810 12/14/2021 2046 12/16/21 0355 12/16/21 0357 12/17/21 0423 12/17/21 0456 12/17/21 2241  HGB 7.8* 8.8*   < >  --    < > 8.2* 9.2* 8.0* 6.1*  --   HCT 27.1* 26.0*   < >  --    < > 28.5* 27.0* 26.7* 18.0*  --   PLT 388  --   --   --   --  327  --  260  --   --   APTT 35  --   --   --   --   --   --   --   --   --   LABPROT 17.3*  --   --   --   --   --   --   --   --   --   INR 1.4*  --   --   --   --   --   --   --   --   --   HEPARINUNFRC  --   --   --   --   --   --   --   --   --  <0.10*  CREATININE 2.04* 1.80*  --   --   --  1.79*  --  1.48*  --   --   TROPONINIHS 625*  --   --  549*  --   --   --   --   --   --    < > = values in this interval not displayed.     Estimated Creatinine Clearance: 26.6 mL/min (A) (by C-G formula based on SCr of 1.48 mg/dL (H)).  Assessment: 71 y.o. female with new onset Afib for heparin Heparin level subtherapeutic   Goal of Therapy:  Heparin level 0.3-0.7 units/ml Monitor platelets by anticoagulation protocol: Yes   Plan:  Increase Heparin 850 units/hr Check heparin level in 8 hours.  66, PharmD, BCPS

## 2021-12-18 NOTE — Progress Notes (Signed)
ANTICOAGULATION CONSULT NOTE  Pharmacy Consult for Heparin Indication: atrial fibrillation  No Known Allergies  Patient Measurements: Height: 5\' 3"  (160 cm) Weight: 55.8 kg (123 lb 0.3 oz) IBW/kg (Calculated) : 52.4 Heparin Dosing Weight: 47.7 kg  Vital Signs: Temp: 99.1 F (37.3 C) (01/12 1700) Temp Source: Esophageal (01/12 1600) BP: 113/62 (01/12 1900) Pulse Rate: 72 (01/12 1900)  Labs: Recent Labs    12/16/21 0355 12/16/21 0357 12/17/21 0423 12/17/21 0456 12/17/21 2241 12/18/21 0348 12/18/21 0846 12/18/21 1109 12/18/21 1526 12/18/21 1808  HGB 8.2*   < > 8.0*   < >  --  8.0*  --  7.8* 7.8*  --   HCT 28.5*   < > 26.7*   < >  --  25.3*  --  23.0* 23.0*  --   PLT 327  --  260  --   --  219  --   --   --   --   HEPARINUNFRC  --   --   --   --  <0.10*  --  <0.10*  --   --  <0.10*  CREATININE 1.79*  --  1.48*  --   --  1.77*  --   --   --   --    < > = values in this interval not displayed.     Estimated Creatinine Clearance: 24.5 mL/min (A) (by C-G formula based on SCr of 1.77 mg/dL (H)).   Assessment: 46 YOF who has developed new onset Afib - no AC PTA.   Heparin level remains undetectable on 1000 units/hr. Hgb 7.8. No issues with line or bleeding reported per RN.  Goal of Therapy:  Heparin level 0.3-0.7 units/ml Monitor platelets by anticoagulation protocol: Yes   Plan:  Increase heparin infusion to 1150 units/hr F/u 8 hr heparin level   Sherlon Handing, PharmD, BCPS Please see amion for complete clinical pharmacist phone list 12/18/2021 7:54 PM

## 2021-12-19 ENCOUNTER — Inpatient Hospital Stay (HOSPITAL_COMMUNITY): Payer: Medicare Other

## 2021-12-19 DIAGNOSIS — A4101 Sepsis due to Methicillin susceptible Staphylococcus aureus: Secondary | ICD-10-CM | POA: Diagnosis not present

## 2021-12-19 DIAGNOSIS — N179 Acute kidney failure, unspecified: Secondary | ICD-10-CM

## 2021-12-19 DIAGNOSIS — G9340 Encephalopathy, unspecified: Secondary | ICD-10-CM

## 2021-12-19 DIAGNOSIS — R570 Cardiogenic shock: Secondary | ICD-10-CM

## 2021-12-19 DIAGNOSIS — I469 Cardiac arrest, cause unspecified: Secondary | ICD-10-CM | POA: Diagnosis not present

## 2021-12-19 DIAGNOSIS — A419 Sepsis, unspecified organism: Secondary | ICD-10-CM | POA: Diagnosis not present

## 2021-12-19 LAB — BASIC METABOLIC PANEL
Anion gap: 12 (ref 5–15)
BUN: 48 mg/dL — ABNORMAL HIGH (ref 8–23)
CO2: 23 mmol/L (ref 22–32)
Calcium: 7.7 mg/dL — ABNORMAL LOW (ref 8.9–10.3)
Chloride: 100 mmol/L (ref 98–111)
Creatinine, Ser: 2.13 mg/dL — ABNORMAL HIGH (ref 0.44–1.00)
GFR, Estimated: 24 mL/min — ABNORMAL LOW (ref >60)
Glucose, Bld: 171 mg/dL — ABNORMAL HIGH (ref 70–99)
Potassium: 3.4 mmol/L — ABNORMAL LOW (ref 3.5–5.1)
Sodium: 135 mmol/L (ref 135–145)

## 2021-12-19 LAB — HEPARIN LEVEL (UNFRACTIONATED)
Heparin Unfractionated: <0.1 IU/mL — ABNORMAL LOW (ref 0.30–0.70)
Heparin Unfractionated: <0.1 IU/mL — ABNORMAL LOW (ref 0.30–0.70)
Heparin Unfractionated: 0.23 IU/mL — ABNORMAL LOW (ref 0.30–0.70)

## 2021-12-19 LAB — CBC
HCT: 23.3 % — ABNORMAL LOW (ref 36.0–46.0)
Hemoglobin: 7.2 g/dL — ABNORMAL LOW (ref 12.0–15.0)
MCH: 31 pg (ref 26.0–34.0)
MCHC: 30.9 g/dL (ref 30.0–36.0)
MCV: 100.4 fL — ABNORMAL HIGH (ref 80.0–100.0)
Platelets: 179 10*3/uL (ref 150–400)
RBC: 2.32 MIL/uL — ABNORMAL LOW (ref 3.87–5.11)
RDW: 15.2 % (ref 11.5–15.5)
WBC: 18 10*3/uL — ABNORMAL HIGH (ref 4.0–10.5)
nRBC: 0.2 % (ref 0.0–0.2)

## 2021-12-19 LAB — GLUCOSE, CAPILLARY
Glucose-Capillary: 102 mg/dL — ABNORMAL HIGH (ref 70–99)
Glucose-Capillary: 136 mg/dL — ABNORMAL HIGH (ref 70–99)
Glucose-Capillary: 152 mg/dL — ABNORMAL HIGH (ref 70–99)
Glucose-Capillary: 177 mg/dL — ABNORMAL HIGH (ref 70–99)
Glucose-Capillary: 194 mg/dL — ABNORMAL HIGH (ref 70–99)
Glucose-Capillary: 239 mg/dL — ABNORMAL HIGH (ref 70–99)

## 2021-12-19 LAB — MAGNESIUM: Magnesium: 2.2 mg/dL (ref 1.7–2.4)

## 2021-12-19 MED ORDER — PANTOPRAZOLE 2 MG/ML SUSPENSION
40.0000 mg | Freq: Every day | ORAL | Status: DC
  Administered 2021-12-19 – 2021-12-23 (×5): 40 mg
  Filled 2021-12-19 (×4): qty 20

## 2021-12-19 MED ORDER — POTASSIUM CHLORIDE 20 MEQ PO PACK
20.0000 meq | PACK | Freq: Once | ORAL | Status: AC
  Administered 2021-12-19: 20 meq
  Filled 2021-12-19: qty 1

## 2021-12-19 MED ORDER — POTASSIUM CHLORIDE 20 MEQ PO PACK
20.0000 meq | PACK | Freq: Once | ORAL | Status: AC
  Administered 2021-12-19: 20 meq

## 2021-12-19 NOTE — Progress Notes (Signed)
NAME:  Mary DESHAZO, MRN:  [id number], DOB:  [birth year], LOS: 4 ADMISSION DATE:  01/04/2022 CONSULTATION DATE:  12/09/2021 REFERRING MD:  Reather Converse - EDP CHIEF COMPLAINT:  Post-PEA arrest   History of Present Illness:  71 year old woman who presented to [facility] ED 1/9 via EMS after PEA arrest at facility.  Per report it appears patient had a prolonged respiratory arrest at facility with brief PEA cardiac arrest when EMS arrived. CPR performed x 8 minutes with Epi x 1 administered and ROSC.  Given GCS 3 on arrival patient was intubated for airway protection. CTA Chest was obtained to r/o PE; negative for PE but demonstrated significant air-fluid level with mild-moderate thick-walled cavitary lesion of the superior RUL with findings concerning for multifocal infection.  PCCM was consulted for admission.  Pertinent Medical History:   Past Medical History:  Diagnosis Date   Bipolar disorder ([place])    COPD (chronic obstructive pulmonary disease) ([place])    Osteopenia    Personality disorder ([place])    Schizoaffective disorder    Weakness    Significant Hospital Events: Including procedures, antibiotic start and stop dates in addition to other pertinent events   1/9 - OOH arrest at facility, LKN 1330. Found down 1430 with agonal respirations. EMS called and patient found to be in PEA. CPR x 8 mins, 1 Epi with ROSC. Epi gtt via R tibial IO for hypotension. GCS 3 on arrival. Intubated in ED. CTA without PE, +large RUL cavitary PNA superimposed on COPD. Empiric Zosyn/Vanc/Flagyl.  1/10 underwent bedside bronchoscopy for BAL 1/11 rapid A. fib with RVR overnight requiring amiodarone infusion 1/12 weaning amio to 30 -- back in NSR. Weaning pressors.  1/13 in and out of Afib overnight. Hgb down to 7.2. Cr incr to 2.13  Interim History / Subjective:  Overnight report of expectorated tissue when pulm hygiene performed. This was sent to lab   In and out of afib overnight -- NSR this morning   PIVs and Art  lines have fallen out -- skin is becoming very friable and weeping   Hgb drifting down -- 7.2 Cr up to 2.13 from 1.77   Net positive 7L  Objective:  Blood pressure 99/60, pulse 69, temperature 98.1 F (36.7 C), resp. rate (!) 28, height _0  (1.6 m), weight 54.3 kg, SpO2 94 %.    Vent Mode: PRVC FiO2 (%):  [30 %-50 %] 50 % Set Rate:  [28 bmp-29 bmp] 29 bmp Vt Set:  [410 mL] 410 mL PEEP:  [8 cmH20] 8 cmH20 Plateau Pressure:  [15 cmH20-22 cmH20] 22 cmH20   Intake/Output Summary (Last 24 hours) at 12/19/2021 1030 Last data filed at 12/19/2021 0800 Gross per 24 hour  Intake 2766.82 ml  Output 2765 ml  Net 1.82 ml   Filed Weights   12/16/21 0359 12/18/21 0600 12/19/21 0500  Weight: 47.7 kg 55.8 kg 54.3 kg   Physical Examination: General: Critically and chronically ill appearing older adult F intubated sedated NAD  HEENT: NCAT Pink mm Anicteric sclera ETT secure  Neuro: Grimace to pain. Does not follow commands. Eyes are open spontaneously  CV: RRR s1s2 cap refill < 3 sec  PULM: Coarse, R sided rhonchi. Mechanically ventilated  GI: soft ndnt + bowel sounds  Extremities: RUE hematoma proximal to [facility]. No acute joint deformity. No cyanosis or clubbing  Skin: pale, moist, scattered ecchymosis   Resolved Hospital Problem List:    Assessment & Plan:   Acute metabolic encephalopathy superimposed on underlying Bipolar disorder,  Schizoaffective disorder  -Home medications include Cogentin, Ativan, Zyprexa, Topamax, valproic acid -acute encephalopathy 2/2 meds, cardiac arrest, shock, sepsis  -Initial CT H without acute abnormality  P: -cont zyprexa, VPA, cogentin  -hold topiramate 2/2 metabolic acidosis  -wean acutely sedating meds as able  -need to discuss GOC with caregiver -- if aggressive care to be pursued, would consider an MRI   Acute on chronic respiratory failure with hypoxemia and hypercarbia RUL Cavitary PNA MSSA PNA COPD  -CTA Chest w mild-moderate thick-walled  cavitary lesion of RUL c/f multifocal pneumonia. No history of immunosuppressive medications. -Baseline COPD w/ emphysema  P: -cont airborne precautions -- r/o TB -ancef for MSSA -wean MV as able  -brovana, duoneb, solumedrol  -PRN CXR, ABG   OOH PEA Arrest  -total downtime unclear. CPR x 8 min -Suspected hypoxemic arrest, given PNA. GCS 3 on arrival. -Echocardiogram 1/10 EF 50 to 55% with no wall motion abnormality and grade 1 diastolic dysfunction P: -Supportive care  -follow neuro exam   Septic Shock due to MSSA PNA  -initial concern for possible cardiogenic component to shock, but most c/w septic shock  P: -ancef  -On NE and vaso -- MAP goal > 65. Decr vaso to 0.03, then wean NE. When NE at 80, turn off vaso  New onset Afib RVR (intermittently NSR vs Afib now)  Combined systolic and diastolic HF  P: -cont amio -hep gtt per pharmacist dosing -K > 4 Mag > 2  -hold lasix 1/13   AKI  Hypokalemia  -Cr 2.04 on arrival, baseline 0.8-1 per previous labs -fluctuating Cr -- was down to 1.5s, up to 2.13 on 1/13  P: -trend renal indices UOP   Acute on chronic anemia -acutely suspect hemodilution, iatrogenic blood loss, critical illness, sepsis  P -trend daily -- close monitoring given hep gtt -monitor for s/sx bleed   At risk for malnutrition  P: -EN per RDN   Hyperglycemia P: -SSI   Best Practice: (right click and "Reselect all SmartList Selections" daily)   Diet/type: EN DVT prophylaxis: SCD, heparin infusion  GI prophylaxis: PPI Lines: CVC Foley:  Yes, and it is still needed Code Status:  full code Last date of multidisciplinary goals of care discussion: Legal guardian update pending 1/13    Critical care time:    CRITICAL CARE Performed by: Cristal Generous   Total critical care time: 43 minutes  Critical care time was exclusive of separately billable procedures and treating other patients. Critical care was necessary to treat or prevent imminent or  life-threatening deterioration.  Critical care was time spent personally by me on the following activities: development of treatment plan with patient and/or surrogate as well as nursing, discussions with consultants, evaluation of patient's response to treatment, examination of patient, obtaining history from patient or surrogate, ordering and performing treatments and interventions, ordering and review of laboratory studies, ordering and review of radiographic studies, pulse oximetry and re-evaluation of patient's condition.  Eliseo Gum MSN, AGACNP-BC [place] for pager  12/19/2021, 10:30 AM

## 2021-12-19 NOTE — Progress Notes (Signed)
Brief Nutrition Follow-up:  Cortrak placed today.  Pt remains on vent support. Remains on pressors  Tolerating Vital AF at goal rate via OG, switching to Cortrak now  RD able to obtain Nutrition-Focused Physical exam. Based on clinical findings, pt meets characteristics for severe malnutrition given severe muscle wasting and severe subcutaneous fat loss in the setting of chronic illness. Some loss make be masked by edema. Unsure of dry weight at this time  [employer] Most Recent Value  Orbital Region Severe depletion  Upper Arm Region Unable to assess  Thoracic and Lumbar Region Severe depletion  Buccal Region Unable to assess  Temple Region Severe depletion  Clavicle Bone Region Severe depletion  Clavicle and Acromion Bone Region Severe depletion  Scapular Bone Region Severe depletion  Dorsal Hand Unable to assess  Patellar Region Moderate depletion  Anterior Thigh Region Moderate depletion  Posterior Calf Region Moderate depletion  Edema (RD Assessment) Moderate       Romelle Starcher MS, RDN, LDN, CNSC Registered Dietitian III Clinical Nutrition RD Pager and On-Call Pager Number Located in [place] '

## 2021-12-19 NOTE — Progress Notes (Signed)
ANTICOAGULATION CONSULT NOTE  Pharmacy Consult for Heparin Indication: atrial fibrillation  No Known Allergies  Patient Measurements: Height: 5\' 3"  (160 cm) Weight: 55.8 kg (123 lb 0.3 oz) IBW/kg (Calculated) : 52.4 Heparin Dosing Weight: 47.7 kg  Vital Signs: Temp: 99.5 F (37.5 C) (01/13 0500) Temp Source: Esophageal (01/13 0400) BP: 95/61 (01/13 0400) Pulse Rate: 109 (01/13 0500)  Labs: Recent Labs    12/17/21 0423 12/17/21 0456 12/18/21 0348 12/18/21 0846 12/18/21 1109 12/18/21 1526 12/18/21 1808 12/19/21 0410  HGB 8.0*   < > 8.0*  --  7.8* 7.8*  --  7.2*  HCT 26.7*   < > 25.3*  --  23.0* 23.0*  --  23.3*  PLT 260  --  219  --   --   --   --  179  HEPARINUNFRC  --    < >  --  <0.10*  --   --  <0.10* <0.10*  CREATININE 1.48*  --  1.77*  --   --   --   --  2.13*   < > = values in this interval not displayed.     Estimated Creatinine Clearance: 20.3 mL/min (A) (by C-G formula based on SCr of 2.13 mg/dL (H)).   Assessment: 62 YOF who has developed new onset Afib - no AC PTA.   Heparin level remains undetectable on 1000 units/hr. Hgb 7.8. No issues with line or bleeding reported per RN.  1/13 AM update:  Heparin level remains undetectable Watch Hgb  Goal of Therapy:  Heparin level 0.3-0.7 units/ml Monitor platelets by anticoagulation protocol: Yes   Plan:  Inc heparin to 1300 units/hr 1300 heparin level  Trend Hgb  Narda Bonds, PharmD, BCPS Clinical Pharmacist Phone: [phone]

## 2021-12-19 NOTE — Progress Notes (Signed)
During assessment and suctioning ETT inline, pt coughed up what appeared to be body tissue.... Notified eLink and orders were placed for acid fast smear and acid fast culture.

## 2021-12-19 NOTE — Progress Notes (Signed)
ANTICOAGULATION CONSULT NOTE Pharmacy Consult for Heparin Indication: atrial fibrillation Brief A/P: Heparin level subtherapeutic Increase Heparin rate  No Known Allergies  Patient Measurements: Height: 5\' 3"  (160 cm) Weight: 54.3 kg (119 lb 11.4 oz) IBW/kg (Calculated) : 52.4 Heparin Dosing Weight: 47.7 kg  Vital Signs: Temp: 100.9 F (38.3 C) (01/13 2200) Temp Source: Esophageal (01/13 1600) BP: 128/70 (01/13 2200) Pulse Rate: 82 (01/13 2200)  Labs: Recent Labs    12/17/21 0423 12/17/21 0456 12/18/21 0348 12/18/21 0846 12/18/21 1109 12/18/21 1526 12/18/21 1808 12/19/21 0410 12/19/21 1254 12/19/21 2300  HGB 8.0*   < > 8.0*  --  7.8* 7.8*  --  7.2*  --   --   HCT 26.7*   < > 25.3*  --  23.0* 23.0*  --  23.3*  --   --   PLT 260  --  219  --   --   --   --  179  --   --   HEPARINUNFRC  --    < >  --    < >  --   --    < > <0.10* <0.10* 0.23*  CREATININE 1.48*  --  1.77*  --   --   --   --  2.13*  --   --    < > = values in this interval not displayed.     Estimated Creatinine Clearance: 20.3 mL/min (A) (by C-G formula based on SCr of 2.13 mg/dL (H)).   Assessment: 71 y.o. female with Afib for heparin  Goal of Therapy:  Heparin level 0.3-0.7 units/ml Monitor platelets by anticoagulation protocol: Yes   Plan:  Increase Heparin 1550 units/hr Follow-up am labs.  66, PharmD, BCPS

## 2021-12-19 NOTE — Progress Notes (Signed)
ANTICOAGULATION CONSULT NOTE  Pharmacy Consult for Heparin Indication: atrial fibrillation  No Known Allergies  Patient Measurements: Height: 5\' 3"  (160 cm) Weight: 54.3 kg (119 lb 11.4 oz) IBW/kg (Calculated) : 52.4 Heparin Dosing Weight: 47.7 kg  Vital Signs: Temp: 98.1 F (36.7 C) (01/13 1100) Temp Source: Esophageal (01/13 1200) BP: 107/67 (01/13 1103) Pulse Rate: 71 (01/13 1100)  Labs: Recent Labs    12/17/21 0423 12/17/21 0456 12/18/21 0348 12/18/21 0846 12/18/21 1109 12/18/21 1526 12/18/21 1808 12/19/21 0410 12/19/21 1254  HGB 8.0*   < > 8.0*  --  7.8* 7.8*  --  7.2*  --   HCT 26.7*   < > 25.3*  --  23.0* 23.0*  --  23.3*  --   PLT 260  --  219  --   --   --   --  179  --   HEPARINUNFRC  --    < >  --    < >  --   --  <0.10* <0.10* <0.10*  CREATININE 1.48*  --  1.77*  --   --   --   --  2.13*  --    < > = values in this interval not displayed.     Estimated Creatinine Clearance: 20.3 mL/min (A) (by C-G formula based on SCr of 2.13 mg/dL (H)).   Assessment: 18 YOF who has developed new onset Afib - no AC PTA.   Heparin level remains undetectable on 1300 units/hr - was having issues with IV sites leaking. Hgb 7.2, plt 179. NO s/sx of bleeding or infusion issues.   Goal of Therapy:  Heparin level 0.3-0.7 units/ml Monitor platelets by anticoagulation protocol: Yes   Plan:  Increase heparin to 1450 units/hr Order heparin level in 8 hours Monitor daily HL, CBC, and for s/sx of bleeding   Antonietta Jewel, PharmD, [place] Pharmacist  Phone: [phone] 12/19/2021 2:18 PM  Please check AMION for all [place] phone numbers After 10:00 PM, call [place] [phone]

## 2021-12-19 NOTE — Procedures (Signed)
Cortrak  Tube Type:  Cortrak - 43 inches Tube Location:  Left nare Initial Placement:  Stomach Secured by: Bridle Technique Used to Measure Tube Placement:  Marking at nare/corner of mouth Cortrak Secured At:  80 cm   Cortrak Tube Team Note:  Consult received to place a Cortrak feeding tube.   X-ray is required, abdominal x-ray has been ordered by the Cortrak team. Please confirm tube placement before using the Cortrak tube.   If the tube becomes dislodged please keep the tube and contact the Cortrak team at [email or web address] ([email or web address]) for replacement.  If after hours and replacement cannot be delayed, place a NG tube and confirm placement with an abdominal x-ray.    Hiram Mciver MS, RD, LDN Please refer to AMION for RD and/or RD on-call/weekend/after hours pager   

## 2021-12-19 NOTE — Progress Notes (Signed)
Notified pharmacy of PIV leaking where heparin was running through.... instructions were given to keep Hep at 13 units and recheck hep level as scheduled.

## 2021-12-19 NOTE — TOC Progression Note (Signed)
Transition of Care [facility]) - Progression Note    Patient Details  Name: Mary Avila MRN: [id number] Date of Birth: [birth year]  Transition of [facility]) CM/SW Contact  Delilah Shan, LCSWA Phone Number: 12/19/2021, 3:15 PM  Clinical Narrative:     CSW continues to follow patient. Plan is for patient to return to [facility] when medically ready for dc. CSW will continue to follow and assist with patients dc planning needs.  Expected Discharge Plan: Assisted Living (from [facility]) Barriers to Discharge: Continued Medical Work up  Expected Discharge Plan and Services Expected Discharge Plan: Assisted Living (from [facility]) In-house Referral: Clinical Social Work     Living arrangements for the past 2 months: Assisted Living Facility                                       Social Determinants of Health (SDOH) Interventions    Readmission Risk Interventions No flowsheet data found.

## 2021-12-19 NOTE — Progress Notes (Addendum)
Updates given to legal guardian   She expresses desire for aggressive medical care, including potentially prolonged support on MV   ___________  Attempted to reach legal guardian at number listed in chart, sent to VM    Will attempt again later as able   Eliseo Gum MSN, AGACNP-BC [place] 12/19/2021, 11:06 AM

## 2021-12-19 NOTE — Progress Notes (Signed)
eLink Physician-Brief Progress Note Patient Name: Mary Avila DOB: 04-25-1951 MRN: [id number]   Date of Service  12/19/2021  HPI/Events of Note  K+ 3.4, Creatinine 2.13, GFR 24. Patient has a history of atrial fibrillation with RVR and is currently on Amiodarone gtt.  eICU Interventions  KCL 20 meq  enterally x 1 ordered.        [name] [name] 12/19/2021, 5:32 AM

## 2021-12-20 DIAGNOSIS — A419 Sepsis, unspecified organism: Secondary | ICD-10-CM | POA: Diagnosis not present

## 2021-12-20 DIAGNOSIS — J9601 Acute respiratory failure with hypoxia: Secondary | ICD-10-CM | POA: Diagnosis not present

## 2021-12-20 DIAGNOSIS — R6521 Severe sepsis with septic shock: Secondary | ICD-10-CM | POA: Diagnosis not present

## 2021-12-20 DIAGNOSIS — E43 Unspecified severe protein-calorie malnutrition: Secondary | ICD-10-CM | POA: Insufficient documentation

## 2021-12-20 DIAGNOSIS — I469 Cardiac arrest, cause unspecified: Secondary | ICD-10-CM | POA: Diagnosis not present

## 2021-12-20 DIAGNOSIS — A4101 Sepsis due to Methicillin susceptible Staphylococcus aureus: Secondary | ICD-10-CM | POA: Diagnosis not present

## 2021-12-20 LAB — GLUCOSE, CAPILLARY
Glucose-Capillary: 131 mg/dL — ABNORMAL HIGH (ref 70–99)
Glucose-Capillary: 151 mg/dL — ABNORMAL HIGH (ref 70–99)
Glucose-Capillary: 206 mg/dL — ABNORMAL HIGH (ref 70–99)
Glucose-Capillary: 217 mg/dL — ABNORMAL HIGH (ref 70–99)
Glucose-Capillary: 211 mg/dL — ABNORMAL HIGH (ref 70–99)
Glucose-Capillary: 169 mg/dL — ABNORMAL HIGH (ref 70–99)

## 2021-12-20 LAB — POCT I-STAT 7, (LYTES, BLD GAS, ICA,H+H)
Acid-base deficit: 5 mmol/L — ABNORMAL HIGH (ref 0.0–2.0)
Bicarbonate: 25.8 mmol/L (ref 20.0–28.0)
Calcium, Ion: 1.15 mmol/L (ref 1.15–1.40)
HCT: 27 % — ABNORMAL LOW (ref 36.0–46.0)
Hemoglobin: 9.2 g/dL — ABNORMAL LOW (ref 12.0–15.0)
O2 Saturation: 83 %
Patient temperature: 98.7
Potassium: 4.8 mmol/L (ref 3.5–5.1)
Sodium: 135 mmol/L (ref 135–145)
TCO2: 28 mmol/L (ref 22–32)
pCO2 arterial: 84.7 mmHg (ref 32.0–48.0)
pH, Arterial: 7.092 — CL (ref 7.350–7.450)
pO2, Arterial: 66 mmHg — ABNORMAL LOW (ref 83.0–108.0)

## 2021-12-20 LAB — BASIC METABOLIC PANEL
Anion gap: 15 (ref 5–15)
BUN: 67 mg/dL — ABNORMAL HIGH (ref 8–23)
CO2: 24 mmol/L (ref 22–32)
Calcium: 7.9 mg/dL — ABNORMAL LOW (ref 8.9–10.3)
Chloride: 97 mmol/L — ABNORMAL LOW (ref 98–111)
Creatinine, Ser: 2.31 mg/dL — ABNORMAL HIGH (ref 0.44–1.00)
GFR, Estimated: 22 mL/min — ABNORMAL LOW (ref >60)
Glucose, Bld: 131 mg/dL — ABNORMAL HIGH (ref 70–99)
Potassium: 3.7 mmol/L (ref 3.5–5.1)
Sodium: 136 mmol/L (ref 135–145)

## 2021-12-20 LAB — CBC
HCT: 20.8 % — ABNORMAL LOW (ref 36.0–46.0)
Hemoglobin: 6.7 g/dL — CL (ref 12.0–15.0)
MCH: 31.9 pg (ref 26.0–34.0)
MCHC: 32.2 g/dL (ref 30.0–36.0)
MCV: 99 fL (ref 80.0–100.0)
Platelets: 149 10*3/uL — ABNORMAL LOW (ref 150–400)
RBC: 2.1 MIL/uL — ABNORMAL LOW (ref 3.87–5.11)
RDW: 15.8 % — ABNORMAL HIGH (ref 11.5–15.5)
WBC: 14.1 10*3/uL — ABNORMAL HIGH (ref 4.0–10.5)
nRBC: 0.4 % — ABNORMAL HIGH (ref 0.0–0.2)

## 2021-12-20 LAB — ABO/RH: ABO/RH(D): O POS

## 2021-12-20 LAB — HEMOGLOBIN AND HEMATOCRIT, BLOOD
HCT: 25.9 % — ABNORMAL LOW (ref 36.0–46.0)
Hemoglobin: 8 g/dL — ABNORMAL LOW (ref 12.0–15.0)

## 2021-12-20 LAB — CULTURE, RESPIRATORY W GRAM STAIN: Culture: NORMAL

## 2021-12-20 LAB — ACID FAST SMEAR (AFB, MYCOBACTERIA): Acid Fast Smear: POSITIVE — AB

## 2021-12-20 LAB — HEPARIN LEVEL (UNFRACTIONATED)
Heparin Unfractionated: 0.19 IU/mL — ABNORMAL LOW (ref 0.30–0.70)
Heparin Unfractionated: 0.51 IU/mL (ref 0.30–0.70)

## 2021-12-20 LAB — CULTURE, BLOOD (ROUTINE X 2)
Culture: NO GROWTH
Culture: NO GROWTH
Special Requests: ADEQUATE

## 2021-12-20 LAB — PREPARE RBC (CROSSMATCH)

## 2021-12-20 LAB — MAGNESIUM: Magnesium: 2.3 mg/dL (ref 1.7–2.4)

## 2021-12-20 MED ORDER — SODIUM BICARBONATE 8.4 % IV SOLN
100.0000 meq | Freq: Once | INTRAVENOUS | Status: AC
  Administered 2021-12-20: 100 meq via INTRAVENOUS
  Filled 2021-12-20: qty 50

## 2021-12-20 MED ORDER — AMIODARONE HCL 200 MG PO TABS: 200.0000 mg | ORAL_TABLET | Freq: Two times a day (BID) | ORAL | Status: DC

## 2021-12-20 MED ORDER — DEXMEDETOMIDINE HCL IN NACL 400 MCG/100ML IV SOLN
0.4000 ug/kg/h | INTRAVENOUS | Status: DC
  Administered 2021-12-20: 0.2 ug/kg/h via INTRAVENOUS
  Administered 2021-12-21: 0.7 ug/kg/h via INTRAVENOUS
  Administered 2021-12-21: 1.2 ug/kg/h via INTRAVENOUS
  Administered 2021-12-21 – 2021-12-23 (×6): 1 ug/kg/h via INTRAVENOUS
  Filled 2021-12-20 (×8): qty 100

## 2021-12-20 MED ORDER — STERILE WATER FOR INJECTION IV SOLN
INTRAVENOUS | Status: DC
  Filled 2021-12-20 (×6): qty 1000

## 2021-12-20 MED ORDER — THROMBI-PAD 3"X3" EX PADS
1.0000 | MEDICATED_PAD | Freq: Once | CUTANEOUS | Status: DC
Start: 2021-12-20 — End: 2021-12-23

## 2021-12-20 MED ORDER — AMIODARONE HCL IN DEXTROSE 360-4.14 MG/200ML-% IV SOLN
30.0000 mg/h | INTRAVENOUS | Status: AC
  Administered 2021-12-20: 30 mg/h via INTRAVENOUS
  Filled 2021-12-20: qty 200

## 2021-12-20 MED ORDER — POTASSIUM CHLORIDE 20 MEQ PO PACK
40.0000 meq | PACK | Freq: Once | ORAL | Status: AC
  Administered 2021-12-20: 40 meq
  Filled 2021-12-20: qty 2

## 2021-12-20 MED ORDER — SODIUM CHLORIDE 0.9% IV SOLUTION: Freq: Once | INTRAVENOUS | Status: AC

## 2021-12-20 MED ORDER — IPRATROPIUM-ALBUTEROL 0.5-2.5 (3) MG/3ML IN SOLN
3.0000 mL | RESPIRATORY_TRACT | Status: DC | PRN
  Administered 2021-12-20: 3 mL via RESPIRATORY_TRACT
  Filled 2021-12-20: qty 3

## 2021-12-20 MED ORDER — BUDESONIDE 0.5 MG/2ML IN SUSP
0.5000 mg | Freq: Two times a day (BID) | RESPIRATORY_TRACT | Status: DC
  Administered 2021-12-20 – 2021-12-23 (×7): 0.5 mg via RESPIRATORY_TRACT
  Filled 2021-12-20 (×7): qty 2

## 2021-12-20 MED ORDER — SODIUM CHLORIDE 3 % IN NEBU
4.0000 mL | INHALATION_SOLUTION | Freq: Two times a day (BID) | RESPIRATORY_TRACT | Status: AC
  Administered 2021-12-20 – 2021-12-22 (×6): 4 mL via RESPIRATORY_TRACT
  Filled 2021-12-20 (×6): qty 4

## 2021-12-20 MED ORDER — REVEFENACIN 175 MCG/3ML IN SOLN
175.0000 ug | Freq: Every day | RESPIRATORY_TRACT | Status: DC
  Administered 2021-12-20 – 2021-12-23 (×4): 175 ug via RESPIRATORY_TRACT
  Filled 2021-12-20 (×4): qty 3

## 2021-12-20 NOTE — Progress Notes (Signed)
NAME:  NINNIE FEIN, MRN:  [id number], DOB:  1951-10-02, LOS: 5 ADMISSION DATE:  12/27/2021 CONSULTATION DATE:  12/30/2021 REFERRING MD:  Reather Converse - EDP CHIEF COMPLAINT:  Post-PEA arrest   History of Present Illness:  71 year old woman who presented to [facility] ED 1/9 via EMS after PEA arrest at facility.  Per report it appears patient had a prolonged respiratory arrest at facility with brief PEA cardiac arrest when EMS arrived. CPR performed x 8 minutes with Epi x 1 administered and ROSC.  Given GCS 3 on arrival patient was intubated for airway protection. CTA Chest was obtained to r/o PE; negative for PE but demonstrated significant air-fluid level with mild-moderate thick-walled cavitary lesion of the superior RUL with findings concerning for multifocal infection.  PCCM was consulted for admission.  Pertinent Medical History:   Past Medical History:  Diagnosis Date   Bipolar disorder ([place])    COPD (chronic obstructive pulmonary disease) ([place])    Osteopenia    Personality disorder ([place])    Schizoaffective disorder    Weakness    Significant Hospital Events: Including procedures, antibiotic start and stop dates in addition to other pertinent events   1/9 - OOH arrest at facility, LKN 1330. Found down 1430 with agonal respirations. EMS called and patient found to be in PEA. CPR x 8 mins, 1 Epi with ROSC. Epi gtt via R tibial IO for hypotension. GCS 3 on arrival. Intubated in ED. CTA without PE, +large RUL cavitary PNA superimposed on COPD. Empiric Zosyn/Vanc/Flagyl.  1/10 underwent bedside bronchoscopy for BAL 1/11 rapid A. fib with RVR overnight requiring amiodarone infusion 1/12 weaning amio to 30 -- back in NSR. Weaning pressors.  1/13 in and out of Afib overnight. Hgb down to 7.2. Cr incr to 2.13 , drop PEEP to 5, airborne isolation DC'd  Interim History / Subjective:   Remains critically ill, on vent, lower doses of Levophed. Low-grade febrile late evening yesterday On fentanyl for  sedation and vent synchrony Urine output 1.2 L but about 7.5 L positive  Objective:  Blood pressure (!) 108/59, pulse 68, temperature 98.4 F (36.9 C), resp. rate 20, height _0  (1.6 m), weight 54.3 kg, SpO2 94 %.    Vent Mode: PRVC FiO2 (%):  [50 %-60 %] 60 % Set Rate:  [20 bmp] 20 bmp Vt Set:  [410 mL] 410 mL PEEP:  [5 cmH20-8 cmH20] 5 cmH20 Plateau Pressure:  [10 cmH20-15 cmH20] 10 cmH20   Intake/Output Summary (Last 24 hours) at 12/20/2021 0802 Last data filed at 12/20/2021 0700 Gross per 24 hour  Intake 1700.6 ml  Output 1235 ml  Net 465.6 ml    Filed Weights   12/16/21 0359 12/18/21 0600 12/19/21 0500  Weight: 47.7 kg 55.8 kg 54.3 kg   Physical Examination: General: Critically and chronically ill appearing older adult F intubated sedated NAD  HEENT: NCAT Pink mm Anicteric sclera ETT secure  Neuro: RASS -2 on fentanyl drip CV: RRR s1s2 cap refill < 3 sec  PULM: Right-sided rhonchi, no accessory muscle use left air entry present GI: soft ndnt + bowel sounds  Extremities: RUE hematoma proximal to [facility]. No acute joint deformity. No cyanosis or clubbing  Skin: pale, moist, scattered ecchymosis    Labs show decrease leukocytosis, hemoglobin has dropped from 7.2-6.7, normal electrolytes, creatinine slight increase to 2.3  Chest x-ray 1/13 independently reviewed shows changes of emphysema, cavitary lesion in the right upper lobe and opacification of mid/right lower lobe  Resolved Hospital Problem List:  Assessment & Plan:   Acute metabolic encephalopathy superimposed on underlying Bipolar disorder, Schizoaffective disorder  -Home medications include Cogentin, Ativan, Zyprexa, Topamax, valproic acid -acute encephalopathy 2/2 meds, cardiac arrest, shock,GCS 3 on arrival. -Initial CT H without acute abnormality  P: -cont zyprexa, VPA, cogentin  -hold topiramate 2/2 metabolic acidosis  -We will attempt to taper fentanyl 12, goal RASS 0 to +1 -MRI brain  Acute on  chronic respiratory failure with hypoxemia and hypercarbia COPD with severe emphysema  P: -Have dropped PEEP to 5, start spontaneous breathing trials -brovana, add budesonide and Yupelri -DuoNebs to as needed Solu-Medrol 40 every 24 while bronchospasm persists   RUL Cavitary PNA MSSA PNA -ct Ancef -Tracheobronchial toilet , add saline nebs  OOH PEA Arrest  -total downtime unclear. CPR x 8 min -Suspected hypoxemic arrest, given PNA.  -Echocardiogram 1/10 EF 50 to 55% with no wall motion abnormality and grade 1 diastolic dysfunction P: -Supportive care    Septic Shock due to MSSA PNA  -initial concern for possible cardiogenic component to shock, but most c/w septic shock  P: -ancef  -On NE and vaso -- MAP goal > 65.   New onset Afib RVR -now maintaining normal sinus rhythm Combined systolic and diastolic HF  P: -Change amio to oral -hep gtt per pharmacist dosing   AKI  Hypokalemia  -Cr 2.04 on arrival, baseline 0.8-1 per previous labs -fluctuating Cr -- was down to 1.5s, up to 2.13 on 1/13  P: -trend renal indices UOP  -hold lasix   Acute on chronic anemia -acutely suspect hemodilution, iatrogenic blood loss, critical illness, sepsis  P -Dropped to 6.7, transfuse 1 unit PRBC - close monitoring given hep gtt -monitor for s/sx bleed   Protein calorie malnutrition  P: -Tube feeds at goal  Hyperglycemia P: -SSI   Best Practice: (right click and "Reselect all SmartList Selections" daily)   Diet/type: EN DVT prophylaxis: SCD, heparin infusion  GI prophylaxis: PPI Lines: CVC Foley:  Yes, and it is still needed Code Status:  full code Last date of multidisciplinary goals of care discussion: Legal guardian  1/13, full scope of care    Critical care time:    CRITICAL CARE Performed by: Leanna Sato Ashlynd Michna   Total critical care time: 35 minutes  Critical care time was exclusive of separately billable procedures and treating other patients. Critical care  was necessary to treat or prevent imminent or life-threatening deterioration.  Critical care was time spent personally by me on the following activities: development of treatment plan with patient and/or surrogate as well as nursing, discussions with consultants, evaluation of patient's response to treatment, examination of patient, obtaining history from patient or surrogate, ordering and performing treatments and interventions, ordering and review of laboratory studies, ordering and review of radiographic studies, pulse oximetry and re-evaluation of patient's condition.  Kara Mead MD. Shade Flood. [place] [facility] Pager : [phone]  If no response to pager , please call [phone] until 7 pm After 7:00 pm call [place]  [phone]    12/20/2021, 8:02 AM

## 2021-12-20 NOTE — Progress Notes (Addendum)
Patient's old IV site in left [facility] began to bleed. Manual pressure held for over an hour.  Pharmacy aware.  RN will continue to monitor closely.

## 2021-12-20 NOTE — Progress Notes (Signed)
ANTICOAGULATION CONSULT NOTE Pharmacy Consult for Heparin Indication: atrial fibrillation Brief A/P: Heparin level subtherapeutic Increase Heparin rate  No Known Allergies  Patient Measurements: Height: 5\' 3"  (160 cm) Weight: 54.3 kg (119 lb 11.4 oz) IBW/kg (Calculated) : 52.4 Heparin Dosing Weight: 47.7 kg  Vital Signs: Temp: 98.1 F (36.7 C) (01/14 0854) Temp Source: Oral (01/14 0854) BP: 102/68 (01/14 0854) Pulse Rate: 99 (01/14 0854)  Labs: Recent Labs    12/18/21 0348 12/18/21 0846 12/18/21 1526 12/18/21 1808 12/19/21 0410 12/19/21 1254 12/19/21 2300 12/20/21 0414 12/20/21 0822  HGB 8.0*   < > 7.8*  --  7.2*  --   --  6.7*  --   HCT 25.3*   < > 23.0*  --  23.3*  --   --  20.8*  --   PLT 219  --   --   --  179  --   --  149*  --   HEPARINUNFRC  --    < >  --    < > <0.10* <0.10* 0.23*  --  0.19*  CREATININE 1.77*  --   --   --  2.13*  --   --  2.31*  --    < > = values in this interval not displayed.     Estimated Creatinine Clearance: 18.7 mL/min (A) (by C-G formula based on SCr of 2.31 mg/dL (H)).   Assessment: 71 y.o. female on heparin for a fib. Heparin level subtherapeutic at 0.19 this morning. Hemoglobin dropped from 7.2 to 6.7 and platelets dropped from 179 to 149. However, no bleeding noted per RN.   Goal of Therapy:  Heparin level 0.3-0.7 units/ml Monitor platelets by anticoagulation protocol: Yes   Plan:  Increase Heparin 1650 units/hr Heparin level in 8 hours Daily heparin level and CBC while on heparin  Cathrine Muster, PharmD PGY2 Cardiology Pharmacy Resident Phone: [phone] 12/20/2021  9:02 AM  Please check [email or web address] for unit-specific pharmacy phone numbers.

## 2021-12-20 NOTE — Progress Notes (Signed)
eLink Physician-Brief Progress Note Patient Name: Mary Avila DOB: 1951/04/09 MRN: [id number]   Date of Service  12/20/2021  HPI/Events of Note  ABG on 70%/PRVC 18/TV 410/P 8 = 7.092/84.7/66/25.8. Unable to increase PRVC rate past 20 d/t air trapping. Portable CXR already ordered for 12 midnight.  eICU Interventions  Plan: Increase PRVC rate to 20.  NaHCO3 100 meq IV now. NaHCO3 IV infusion to run at 100 mL/hour. Repeat ABG at 4 AM.     Intervention Category Major Interventions: Respiratory failure - evaluation and management;Acid-Base disturbance - evaluation and management  Toni Hoffmeister [place] 12/20/2021, 10:16 PM

## 2021-12-20 NOTE — Progress Notes (Signed)
ANTICOAGULATION CONSULT NOTE Pharmacy Consult for Heparin Indication: atrial fibrillation  No Known Allergies  Patient Measurements: Height: 5\' 3"  (160 cm) Weight: 54.3 kg (119 lb 11.4 oz) IBW/kg (Calculated) : 52.4 Heparin Dosing Weight: 47.7 kg  Vital Signs: Temp: 96.6 F (35.9 C) (01/14 1215) Temp Source: Core (01/14 1200) BP: 110/60 (01/14 1800) Pulse Rate: 71 (01/14 1800)  Labs: Recent Labs    12/18/21 0348 12/18/21 0846 12/19/21 0410 12/19/21 1254 12/19/21 2300 12/20/21 0414 12/20/21 0822 12/20/21 1430 12/20/21 1634  HGB 8.0*   < > 7.2*  --   --  6.7*  --  8.0*  --   HCT 25.3*   < > 23.3*  --   --  20.8*  --  25.9*  --   PLT 219  --  179  --   --  149*  --   --   --   HEPARINUNFRC  --    < > <0.10*   < > 0.23*  --  0.19*  --  0.51  CREATININE 1.77*  --  2.13*  --   --  2.31*  --   --   --    < > = values in this interval not displayed.     Estimated Creatinine Clearance: 18.7 mL/min (A) (by C-G formula based on SCr of 2.31 mg/dL (H)).   Assessment: 71 y.o. female on heparin for a fib. Heparin level subtherapeutic at 0.19 this morning. Hemoglobin dropped from 7.2 to 6.7 and platelets dropped from 179 to 149. However, no bleeding noted per RN.   Heparin level therapeutic on repeat, prior access site no longer bleeding.  Goal of Therapy:  Heparin level 0.3-0.7 units/ml Monitor platelets by anticoagulation protocol: Yes   Plan:  Continue heparin 1650 units/hr Daily heparin level and CBC while on heparin  Arrie Senate, PharmD, BCPS, [facility] Clinical Pharmacist [phone] Please check AMION for all [facility] Pharmacy numbers 12/20/2021

## 2021-12-20 NOTE — Progress Notes (Signed)
[place] Progress Note Patient Name: Mary Avila DOB: [birth year] MRN: [id number]   Date of Service  12/20/2021  HPI/Events of Note  Hemoglobin 6.7 gm / dl. No overt signs of bleeding.  eICU Interventions  One unit of PRBC ordered transfused.        Kerry Kass Seymore Brodowski 12/20/2021, 4:39 AM

## 2021-12-20 NOTE — Progress Notes (Signed)
RT NOTE: patient did not meet SBT criteria this AM due to FIO2 requirements of 60%.  Attempted to wean FIO2 to 40% however patient's sats began to drop to 87%.  Increased FIO2 to 50% and sats have improved.  Will continue to monitor.

## 2021-12-20 NOTE — Progress Notes (Signed)
Nurse attempted to call Pt's guardian "Mary Avila" listed in pt's chart to obtain blood consent. No answer.

## 2021-12-21 ENCOUNTER — Inpatient Hospital Stay (HOSPITAL_COMMUNITY): Payer: Medicare Other

## 2021-12-21 DIAGNOSIS — J851 Abscess of lung with pneumonia: Secondary | ICD-10-CM | POA: Diagnosis not present

## 2021-12-21 DIAGNOSIS — I469 Cardiac arrest, cause unspecified: Secondary | ICD-10-CM | POA: Diagnosis not present

## 2021-12-21 DIAGNOSIS — A4101 Sepsis due to Methicillin susceptible Staphylococcus aureus: Secondary | ICD-10-CM | POA: Diagnosis not present

## 2021-12-21 DIAGNOSIS — A419 Sepsis, unspecified organism: Secondary | ICD-10-CM | POA: Diagnosis not present

## 2021-12-21 DIAGNOSIS — J9601 Acute respiratory failure with hypoxia: Secondary | ICD-10-CM | POA: Diagnosis not present

## 2021-12-21 LAB — POCT I-STAT 7, (LYTES, BLD GAS, ICA,H+H)
Acid-Base Excess: 0 mmol/L (ref 0.0–2.0)
Acid-Base Excess: 3 mmol/L — ABNORMAL HIGH (ref 0.0–2.0)
Acid-Base Excess: 6 mmol/L — ABNORMAL HIGH (ref 0.0–2.0)
Bicarbonate: 28.8 mmol/L — ABNORMAL HIGH (ref 20.0–28.0)
Bicarbonate: 30.9 mmol/L — ABNORMAL HIGH (ref 20.0–28.0)
Bicarbonate: 33.4 mmol/L — ABNORMAL HIGH (ref 20.0–28.0)
Calcium, Ion: 1.11 mmol/L — ABNORMAL LOW (ref 1.15–1.40)
Calcium, Ion: 1.07 mmol/L — ABNORMAL LOW (ref 1.15–1.40)
Calcium, Ion: 1.01 mmol/L — ABNORMAL LOW (ref 1.15–1.40)
HCT: 25 % — ABNORMAL LOW (ref 36.0–46.0)
HCT: 25 % — ABNORMAL LOW (ref 36.0–46.0)
HCT: 27 % — ABNORMAL LOW (ref 36.0–46.0)
Hemoglobin: 8.5 g/dL — ABNORMAL LOW (ref 12.0–15.0)
Hemoglobin: 8.5 g/dL — ABNORMAL LOW (ref 12.0–15.0)
Hemoglobin: 9.2 g/dL — ABNORMAL LOW (ref 12.0–15.0)
O2 Saturation: 93 %
O2 Saturation: 92 %
O2 Saturation: 89 %
Patient temperature: 98.6
Patient temperature: 98.1
Patient temperature: 98
Potassium: 5.2 mmol/L — ABNORMAL HIGH (ref 3.5–5.1)
Potassium: 5 mmol/L (ref 3.5–5.1)
Potassium: 4.7 mmol/L (ref 3.5–5.1)
Sodium: 135 mmol/L (ref 135–145)
Sodium: 135 mmol/L (ref 135–145)
Sodium: 135 mmol/L (ref 135–145)
TCO2: 31 mmol/L (ref 22–32)
TCO2: 33 mmol/L — ABNORMAL HIGH (ref 22–32)
TCO2: 35 mmol/L — ABNORMAL HIGH (ref 22–32)
pCO2 arterial: 78.3 mmHg (ref 32.0–48.0)
pCO2 arterial: 66.6 mmHg (ref 32.0–48.0)
pCO2 arterial: 67.6 mmHg (ref 32.0–48.0)
pH, Arterial: 7.173 — CL (ref 7.350–7.450)
pH, Arterial: 7.274 — ABNORMAL LOW (ref 7.350–7.450)
pH, Arterial: 7.3 — ABNORMAL LOW (ref 7.350–7.450)
pO2, Arterial: 88 mmHg (ref 83.0–108.0)
pO2, Arterial: 74 mmHg — ABNORMAL LOW (ref 83.0–108.0)
pO2, Arterial: 63 mmHg — ABNORMAL LOW (ref 83.0–108.0)

## 2021-12-21 LAB — CBC WITH DIFFERENTIAL/PLATELET
Abs Immature Granulocytes: 0.88 10*3/uL — ABNORMAL HIGH (ref 0.00–0.07)
Basophils Absolute: 0.1 10*3/uL (ref 0.0–0.1)
Basophils Relative: 0 %
Eosinophils Absolute: 0 10*3/uL (ref 0.0–0.5)
Eosinophils Relative: 0 %
HCT: 24.7 % — ABNORMAL LOW (ref 36.0–46.0)
Hemoglobin: 7.6 g/dL — ABNORMAL LOW (ref 12.0–15.0)
Immature Granulocytes: 7 %
Lymphocytes Relative: 6 %
Lymphs Abs: 0.8 10*3/uL (ref 0.7–4.0)
MCH: 28.9 pg (ref 26.0–34.0)
MCHC: 30.8 g/dL (ref 30.0–36.0)
MCV: 93.9 fL (ref 80.0–100.0)
Monocytes Absolute: 0.9 10*3/uL (ref 0.1–1.0)
Monocytes Relative: 8 %
Neutro Abs: 9.9 10*3/uL — ABNORMAL HIGH (ref 1.7–7.7)
Neutrophils Relative %: 79 %
Platelets: 160 10*3/uL (ref 150–400)
RBC: 2.63 MIL/uL — ABNORMAL LOW (ref 3.87–5.11)
RDW: 20.1 % — ABNORMAL HIGH (ref 11.5–15.5)
WBC: 12.5 10*3/uL — ABNORMAL HIGH (ref 4.0–10.5)
nRBC: 1.8 % — ABNORMAL HIGH (ref 0.0–0.2)

## 2021-12-21 LAB — BASIC METABOLIC PANEL
Anion gap: 12 (ref 5–15)
Anion gap: 15 (ref 5–15)
BUN: 89 mg/dL — ABNORMAL HIGH (ref 8–23)
BUN: 98 mg/dL — ABNORMAL HIGH (ref 8–23)
CO2: 28 mmol/L (ref 22–32)
CO2: 29 mmol/L (ref 22–32)
Calcium: 7.6 mg/dL — ABNORMAL LOW (ref 8.9–10.3)
Calcium: 7 mg/dL — ABNORMAL LOW (ref 8.9–10.3)
Chloride: 97 mmol/L — ABNORMAL LOW (ref 98–111)
Chloride: 90 mmol/L — ABNORMAL LOW (ref 98–111)
Creatinine, Ser: 2.47 mg/dL — ABNORMAL HIGH (ref 0.44–1.00)
Creatinine, Ser: 2.4 mg/dL — ABNORMAL HIGH (ref 0.44–1.00)
GFR, Estimated: 20 mL/min — ABNORMAL LOW (ref >60)
GFR, Estimated: 21 mL/min — ABNORMAL LOW (ref >60)
Glucose, Bld: 121 mg/dL — ABNORMAL HIGH (ref 70–99)
Glucose, Bld: 283 mg/dL — ABNORMAL HIGH (ref 70–99)
Potassium: 5.2 mmol/L — ABNORMAL HIGH (ref 3.5–5.1)
Potassium: 4.3 mmol/L (ref 3.5–5.1)
Sodium: 137 mmol/L (ref 135–145)
Sodium: 134 mmol/L — ABNORMAL LOW (ref 135–145)

## 2021-12-21 LAB — CBC
HCT: 23.9 % — ABNORMAL LOW (ref 36.0–46.0)
Hemoglobin: 7.5 g/dL — ABNORMAL LOW (ref 12.0–15.0)
MCH: 29.5 pg (ref 26.0–34.0)
MCHC: 31.4 g/dL (ref 30.0–36.0)
MCV: 94.1 fL (ref 80.0–100.0)
Platelets: 145 10*3/uL — ABNORMAL LOW (ref 150–400)
RBC: 2.54 MIL/uL — ABNORMAL LOW (ref 3.87–5.11)
RDW: 20.5 % — ABNORMAL HIGH (ref 11.5–15.5)
WBC: 11.2 10*3/uL — ABNORMAL HIGH (ref 4.0–10.5)
nRBC: 1.6 % — ABNORMAL HIGH (ref 0.0–0.2)

## 2021-12-21 LAB — GLUCOSE, CAPILLARY
Glucose-Capillary: 119 mg/dL — ABNORMAL HIGH (ref 70–99)
Glucose-Capillary: 132 mg/dL — ABNORMAL HIGH (ref 70–99)
Glucose-Capillary: 199 mg/dL — ABNORMAL HIGH (ref 70–99)
Glucose-Capillary: 196 mg/dL — ABNORMAL HIGH (ref 70–99)
Glucose-Capillary: 127 mg/dL — ABNORMAL HIGH (ref 70–99)
Glucose-Capillary: 174 mg/dL — ABNORMAL HIGH (ref 70–99)

## 2021-12-21 LAB — HEPARIN LEVEL (UNFRACTIONATED): Heparin Unfractionated: 0.56 IU/mL (ref 0.30–0.70)

## 2021-12-21 LAB — MAGNESIUM: Magnesium: 2.2 mg/dL (ref 1.7–2.4)

## 2021-12-21 MED ORDER — SODIUM BICARBONATE 8.4 % IV SOLN
100.0000 meq | Freq: Once | INTRAVENOUS | Status: AC
Start: 2021-12-21 — End: 2021-12-21
  Administered 2021-12-21: 100 meq via INTRAVENOUS
  Filled 2021-12-21: qty 50

## 2021-12-21 MED ORDER — FENTANYL CITRATE PF 50 MCG/ML IJ SOSY
25.0000 ug | PREFILLED_SYRINGE | INTRAMUSCULAR | Status: DC | PRN
  Administered 2021-12-21: 50 ug via INTRAVENOUS
  Administered 2021-12-21 (×2): 100 ug via INTRAVENOUS
  Filled 2021-12-21: qty 1
  Filled 2021-12-21: qty 2

## 2021-12-21 MED ORDER — SODIUM ZIRCONIUM CYCLOSILICATE 10 G PO PACK
10.0000 g | PACK | Freq: Once | ORAL | Status: AC
  Administered 2021-12-21: 10 g
  Filled 2021-12-21: qty 1

## 2021-12-21 NOTE — Progress Notes (Signed)
ANTICOAGULATION CONSULT NOTE Pharmacy Consult for Heparin Indication: atrial fibrillation  No Known Allergies  Patient Measurements: Height: 5\' 3"  (160 cm) Weight: 54.3 kg (119 lb 11.4 oz) IBW/kg (Calculated) : 52.4 Heparin Dosing Weight: 47.7 kg  Vital Signs: Temp: 98.6 F (37 C) (01/15 0356) Temp Source: Axillary (01/15 0356) BP: 106/70 (01/15 0700) Pulse Rate: 62 (01/15 0700)  Labs: Recent Labs    12/19/21 0410 12/19/21 1254 12/20/21 0414 12/20/21 0822 12/20/21 1430 12/20/21 1634 12/20/21 2139 12/21/21 0311 12/21/21 0418  HGB 7.2*  --  6.7*  --    < >  --  9.2* 7.5* 8.5*  HCT 23.3*  --  20.8*  --    < >  --  27.0* 23.9* 25.0*  PLT 179  --  149*  --   --   --   --  145*  --   HEPARINUNFRC <0.10*   < >  --  0.19*  --  0.51  --  0.56  --   CREATININE 2.13*  --  2.31*  --   --   --   --  2.47*  --    < > = values in this interval not displayed.     Estimated Creatinine Clearance: 17.5 mL/min (A) (by C-G formula based on SCr of 2.47 mg/dL (H)).   Assessment: 71 y.o. female on heparin for a fib. Heparin level therapeutic at 0.56 on 1650 units/hr today. Level was also therapeutic last night at 0.51. Can move to daily monitoring.  Hemoglobin improved to  7.5 today platelets stable at 145. No bleeding noted per RN.   Patient had bleeding at a prior access site, this is "Weepy" per RN, but improved from yesterday. Will continue to monitor.  Goal of Therapy:  Heparin level 0.3-0.7 units/ml Monitor platelets by anticoagulation protocol: Yes   Plan:  Continue heparin 1650 units/hr Daily heparin level and CBC while on heparin Continue to monitor for signs of bleeding  Cathrine Muster, PharmD PGY2 Cardiology Pharmacy Resident Phone: [phone] 12/21/2021  7:32 AM  Please check [email or web address] for unit-specific pharmacy phone numbers.

## 2021-12-21 NOTE — Progress Notes (Signed)
Spoke with Dr. Vassie Loll and made MD aware that fentanyl drip was turned off at 0800 and that precedex is infusing and patient is working to breath with accessory muscle use. MD stated that he wants to try and not use fentanyl drip to be able to assess neuro status therefore PRNs need to be used.  RN asked MD for PRN fentanyl push orders and MD acknowledged.

## 2021-12-21 NOTE — Progress Notes (Signed)
Sent Dr. Vassie Loll secure chat and made MD aware that patient is requring frequent PRN pushes of fentanyl due to labored breathing with desaturation and that it is not yet time for another fentanyl push per [facility] PRN order and patient is having increased labored breathing. MD gave order okay to start fentanyl drip back to keep low dose.

## 2021-12-21 NOTE — Progress Notes (Signed)
eLink Physician-Brief Progress Note Patient Name: Mary Avila DOB: 16-May-1951 MRN: [id number]   Date of Service  12/21/2021  HPI/Events of Note  Multiple issues: 1. ABG on 70%/PRVC 20/TV 410/P 8 = 7.173/78.388/28.8. 2. Hyperkalemia - K+ = 5.2.  eICU Interventions  Plan: NaHCO3 100 meq IV now. Continue NaHCO3 IV infusion. Repeat ABG at 9 AM. Lokelma 10 gm per tube now.  Repeat BMP at 12 noon.     Intervention Category Major Interventions: Respiratory failure - evaluation and management;Acid-Base disturbance - evaluation and management  Naira Standiford Eugene 12/21/2021, 4:57 AM

## 2021-12-21 NOTE — Progress Notes (Signed)
Pt showing dyssynchrony on vent.  RT made changes to vent to assist with ventilation and dyssynchrony.  ABG to follow.

## 2021-12-21 NOTE — Progress Notes (Signed)
RT NOTE: patient does not meet SBT criteria this AM due to FIO2 requirements.  Tolerating current ventilator settings well at this time.  Will continue to monitor.

## 2021-12-21 NOTE — Progress Notes (Signed)
NAME:  Mary Avila, MRN:  [id number], DOB:  May 30, 1951, LOS: 6 ADMISSION DATE:  12/08/2021 CONSULTATION DATE:  12/14/2021 REFERRING MD:  Reather Converse - EDP CHIEF COMPLAINT:  Post-PEA arrest   History of Present Illness:  71 year old woman who presented to [facility] ED 1/9 via EMS after PEA arrest at facility.  Per report it appears patient had a prolonged respiratory arrest at facility with brief PEA cardiac arrest when EMS arrived. CPR performed x 8 minutes with Epi x 1 administered and ROSC.  Given GCS 3 on arrival patient was intubated for airway protection. CTA Chest was obtained to r/o PE; negative for PE but demonstrated significant air-fluid level with mild-moderate thick-walled cavitary lesion of the superior RUL with findings concerning for multifocal infection.  PCCM was consulted for admission.  Pertinent Medical History:   Past Medical History:  Diagnosis Date   Bipolar disorder ([place])    COPD (chronic obstructive pulmonary disease) ([place])    Osteopenia    Personality disorder ([place])    Schizoaffective disorder    Weakness    Significant Hospital Events: Including procedures, antibiotic start and stop dates in addition to other pertinent events   1/9 - OOH arrest at facility, LKN 1330. Found down 1430 with agonal respirations. EMS called and patient found to be in PEA. CPR x 8 mins, 1 Epi with ROSC. Epi gtt via R tibial IO for hypotension. GCS 3 on arrival. Intubated in ED. CTA without PE, +large RUL cavitary PNA superimposed on COPD. Empiric Zosyn/Vanc/Flagyl.  1/10 underwent bedside bronchoscopy for BAL 1/11 rapid A. fib with RVR overnight requiring amiodarone infusion 1/12 weaning amio to 30 -- back in NSR. Weaning pressors.  1/13 in and out of Afib overnight. Hgb down to 7.2. Cr incr to 2.13 , drop PEEP to 5, airborne isolation DC'd 1/14 copious secretions, hypoxia, PEEP added , 1 unit PRBC for hemoglobin 6.7  Interim History / Subjective:   Remains critically ill, intubated On  low-dose Levophed Fentanyl tapered to off and Precedex added. ABG showed respiratory acidosis overnight , bicarbonate drip added Urine output picking up  Objective:  Blood pressure 106/75, pulse 89, temperature 98.1 F (36.7 C), temperature source Axillary, resp. rate (!) 23, height _0  (1.6 m), weight 54.3 kg, SpO2 97 %.    Vent Mode: PRVC FiO2 (%):  [60 %-70 %] 60 % Set Rate:  [18 bmp-24 bmp] 24 bmp Vt Set:  [410 mL] 410 mL PEEP:  [8 cmH20] 8 cmH20 Plateau Pressure:  [14 cmH20-22 cmH20] 22 cmH20   Intake/Output Summary (Last 24 hours) at 12/21/2021 1016 Last data filed at 12/21/2021 1000 Gross per 24 hour  Intake 4649.29 ml  Output 1175 ml  Net 3474.29 ml    Filed Weights   12/16/21 0359 12/18/21 0600 12/19/21 0500  Weight: 47.7 kg 55.8 kg 54.3 kg   Physical Examination: General: Critically and chronically ill appearing older adult F intubated sedated NAD  HEENT: NCAT Pink mm Anicteric sclera ETT secure  Neuro: RASS 0, eyes open but does not follow commands CV: RRR s1s2 cap refill < 3 sec  PULM: Decreased breath sounds on the left, crackles on right, no accessory muscle use GI: soft ndnt + bowel sounds  Extremities: RUE hematoma proximal to [facility]. No acute joint deformity. No cyanosis or clubbing  Skin: pale, moist, scattered ecchymosis    Labs show decreasing leukocytosis, stable anemia, increasing creatinine from 2.3-2.5 ABG shows improving respiratory acidosis  Chest x-ray 1/15 independently reviewed shows changes of  emphysema, cavitary lesion in the right upper lobe and worsening aeration right lower lobe  Resolved Hospital Problem List:    Assessment & Plan:   Acute metabolic encephalopathy superimposed on underlying Bipolar disorder, Schizoaffective disorder  -Home medications include Cogentin, Ativan, Zyprexa, Topamax, valproic acid -acute encephalopathy 2/2 meds, cardiac arrest, shock,GCS 3 on arrival. -Initial CT H without acute abnormality  P: -cont  zyprexa, VPA, cogentin  -hold topiramate 2/2 metabolic acidosis  -Use Precedex and attempt to keep off fentanyl, 20 able neurochecks, goal RASS 0 -Await MRI brain  Acute on chronic respiratory failure with hypoxemia and hypercarbia COPD with severe emphysema Acute respiratory acidosis 1/15  P: -Keep PEEP to 8 even though unilateral process -Daughter has been increased -brovana,  budesonide and Yupelri -DuoNebs to as needed Solu-Medrol 40 every 24 while bronchospasm persists   RUL Cavitary PNA MSSA PNA Favor right lower lobe atelectasis/consolidation status post bronchoscopy 1/10  -ct Ancef --Tracheobronchial toilet with chest PT,  hypertonic saline nebs  OOH PEA Arrest  -total downtime unclear. CPR x 8 min -Suspected hypoxemic arrest, given PNA.  -Echocardiogram 1/10 EF 50 to 55% with no wall motion abnormality and grade 1 diastolic dysfunction P: -Supportive care    Septic Shock due to MSSA PNA  -initial concern for possible cardiogenic component to shock, but most c/w septic shock  P: -Pressors slowly being tapered to off  New onset Afib RVR -now maintaining normal sinus rhythm Combined systolic and diastolic HF  P: -Continue IV amio -hep gtt per pharmacist dosing   AKI  Hyperkalemia  -Cr 2.04 on arrival, baseline 0.8-1 per previous labs -fluctuating Cr -- was down to 1.5s, up to 2.13 on 1/13  P: -trend renal indices UOP  -hold lasix  -Using bicarbonate drip for respiratory acidosis to maintain pH 7.25 and above, can discontinue once pH rises  Acute on chronic anemia -acutely suspect hemodilution, iatrogenic blood loss, critical illness, sepsis  -s/p 1 unit PRBC on 1/14 P  - close monitoring given hep gtt -monitor for s/sx bleed   Protein calorie malnutrition  P: -Tube feeds at goal  Hyperglycemia P: -SSI   Best Practice: (right click and "Reselect all SmartList Selections" daily)   Diet/type: EN DVT prophylaxis: SCD, heparin infusion  GI  prophylaxis: PPI Lines: CVC Foley:  Yes, and it is still needed Code Status:  full code Last date of multidisciplinary goals of care discussion: Legal guardian  1/15 Mary Avila fully updated to current status, poor prognosis given vent dependence and poor neurological status , I recommended care limitations, she will discuss with her staff colleagues    Critical care time:    CRITICAL CARE Performed by: Leanna Sato Hester Joslin   Total critical care time: 35 minutes  Critical care time was exclusive of separately billable procedures and treating other patients. Critical care was necessary to treat or prevent imminent or life-threatening deterioration.  Critical care was time spent personally by me on the following activities: development of treatment plan with patient and/or surrogate as well as nursing, discussions with consultants, evaluation of patient's response to treatment, examination of patient, obtaining history from patient or surrogate, ordering and performing treatments and interventions, ordering and review of laboratory studies, ordering and review of radiographic studies, pulse oximetry and re-evaluation of patient's condition.  Kara Mead MD. Shade Flood. [facility] Pager : [phone]  If no response to pager , please call [phone] until 7 pm After 7:00 pm call Elink  [phone]  12/21/2021, 10:16 AM

## 2021-12-22 ENCOUNTER — Inpatient Hospital Stay (HOSPITAL_COMMUNITY): Payer: Medicare Other

## 2021-12-22 DIAGNOSIS — I469 Cardiac arrest, cause unspecified: Secondary | ICD-10-CM | POA: Diagnosis not present

## 2021-12-22 DIAGNOSIS — A4101 Sepsis due to Methicillin susceptible Staphylococcus aureus: Secondary | ICD-10-CM | POA: Diagnosis not present

## 2021-12-22 LAB — BASIC METABOLIC PANEL
Anion gap: 11 (ref 5–15)
BUN: 102 mg/dL — ABNORMAL HIGH (ref 8–23)
CO2: 32 mmol/L (ref 22–32)
Calcium: 6.8 mg/dL — ABNORMAL LOW (ref 8.9–10.3)
Chloride: 92 mmol/L — ABNORMAL LOW (ref 98–111)
Creatinine, Ser: 2.22 mg/dL — ABNORMAL HIGH (ref 0.44–1.00)
GFR, Estimated: 23 mL/min — ABNORMAL LOW (ref >60)
Glucose, Bld: 198 mg/dL — ABNORMAL HIGH (ref 70–99)
Potassium: 4.7 mmol/L (ref 3.5–5.1)
Sodium: 135 mmol/L (ref 135–145)

## 2021-12-22 LAB — TYPE AND SCREEN
ABO/RH(D): O POS
Antibody Screen: NEGATIVE
Unit division: 0

## 2021-12-22 LAB — CBC
HCT: 24.9 % — ABNORMAL LOW (ref 36.0–46.0)
Hemoglobin: 7.7 g/dL — ABNORMAL LOW (ref 12.0–15.0)
MCH: 29.1 pg (ref 26.0–34.0)
MCHC: 30.9 g/dL (ref 30.0–36.0)
MCV: 94 fL (ref 80.0–100.0)
Platelets: 190 10*3/uL (ref 150–400)
RBC: 2.65 MIL/uL — ABNORMAL LOW (ref 3.87–5.11)
RDW: 19.6 % — ABNORMAL HIGH (ref 11.5–15.5)
WBC: 15.5 10*3/uL — ABNORMAL HIGH (ref 4.0–10.5)
nRBC: 1.9 % — ABNORMAL HIGH (ref 0.0–0.2)

## 2021-12-22 LAB — BPAM RBC
Blood Product Expiration Date: 202302032359
ISSUE DATE / TIME: 202301140841
Unit Type and Rh: 5100

## 2021-12-22 LAB — POCT I-STAT 7, (LYTES, BLD GAS, ICA,H+H)
Acid-Base Excess: 8 mmol/L — ABNORMAL HIGH (ref 0.0–2.0)
Acid-Base Excess: 9 mmol/L — ABNORMAL HIGH (ref 0.0–2.0)
Bicarbonate: 36.2 mmol/L — ABNORMAL HIGH (ref 20.0–28.0)
Bicarbonate: 38.5 mmol/L — ABNORMAL HIGH (ref 20.0–28.0)
Calcium, Ion: 1 mmol/L — ABNORMAL LOW (ref 1.15–1.40)
Calcium, Ion: 1.04 mmol/L — ABNORMAL LOW (ref 1.15–1.40)
HCT: 28 % — ABNORMAL LOW (ref 36.0–46.0)
HCT: 27 % — ABNORMAL LOW (ref 36.0–46.0)
Hemoglobin: 9.5 g/dL — ABNORMAL LOW (ref 12.0–15.0)
Hemoglobin: 9.2 g/dL — ABNORMAL LOW (ref 12.0–15.0)
O2 Saturation: 70 %
O2 Saturation: 99 %
Patient temperature: 97.2
Patient temperature: 97.6
Potassium: 4.3 mmol/L (ref 3.5–5.1)
Potassium: 4 mmol/L (ref 3.5–5.1)
Sodium: 136 mmol/L (ref 135–145)
Sodium: 135 mmol/L (ref 135–145)
TCO2: 39 mmol/L — ABNORMAL HIGH (ref 22–32)
TCO2: 41 mmol/L — ABNORMAL HIGH (ref 22–32)
pCO2 arterial: 76.1 mmHg (ref 32.0–48.0)
pCO2 arterial: 86.9 mmHg (ref 32.0–48.0)
pH, Arterial: 7.282 — ABNORMAL LOW (ref 7.350–7.450)
pH, Arterial: 7.251 — ABNORMAL LOW (ref 7.350–7.450)
pO2, Arterial: 42 mmHg — ABNORMAL LOW (ref 83.0–108.0)
pO2, Arterial: 201 mmHg — ABNORMAL HIGH (ref 83.0–108.0)

## 2021-12-22 LAB — GLUCOSE, CAPILLARY
Glucose-Capillary: 176 mg/dL — ABNORMAL HIGH (ref 70–99)
Glucose-Capillary: 169 mg/dL — ABNORMAL HIGH (ref 70–99)
Glucose-Capillary: 220 mg/dL — ABNORMAL HIGH (ref 70–99)
Glucose-Capillary: 175 mg/dL — ABNORMAL HIGH (ref 70–99)
Glucose-Capillary: 161 mg/dL — ABNORMAL HIGH (ref 70–99)
Glucose-Capillary: 174 mg/dL — ABNORMAL HIGH (ref 70–99)

## 2021-12-22 LAB — HEPARIN LEVEL (UNFRACTIONATED)
Heparin Unfractionated: 0.75 IU/mL — ABNORMAL HIGH (ref 0.30–0.70)
Heparin Unfractionated: 0.63 IU/mL (ref 0.30–0.70)

## 2021-12-22 MED ORDER — FUROSEMIDE 10 MG/ML IJ SOLN
60.0000 mg | Freq: Once | INTRAMUSCULAR | Status: AC
  Administered 2021-12-22: 60 mg via INTRAVENOUS
  Filled 2021-12-22: qty 6

## 2021-12-22 MED ORDER — ALBUTEROL SULFATE (2.5 MG/3ML) 0.083% IN NEBU: 2.5000 mg | INHALATION_SOLUTION | RESPIRATORY_TRACT | Status: DC | PRN

## 2021-12-22 MED ORDER — FAMOTIDINE IN NACL 20-0.9 MG/50ML-% IV SOLN
INTRAVENOUS | Status: AC
  Filled 2021-12-22: qty 50

## 2021-12-22 MED ORDER — EPINEPHRINE 1 MG/10ML IJ SOSY
PREFILLED_SYRINGE | INTRAMUSCULAR | Status: AC
  Filled 2021-12-22: qty 10

## 2021-12-22 MED ORDER — AMIODARONE HCL IN DEXTROSE 360-4.14 MG/200ML-% IV SOLN
30.0000 mg/h | INTRAVENOUS | Status: DC
  Administered 2021-12-22 – 2021-12-23 (×2): 30 mg/h via INTRAVENOUS
  Filled 2021-12-22 (×2): qty 200

## 2021-12-22 MED ORDER — METHYLPREDNISOLONE SODIUM SUCC 40 MG IJ SOLR
20.0000 mg | INTRAMUSCULAR | Status: DC
  Administered 2021-12-22: 20 mg via INTRAVENOUS
  Filled 2021-12-22: qty 1

## 2021-12-22 NOTE — Progress Notes (Signed)
Critical ABG results given to Dr. Denese Killings. Per MD increase pt's PEEP.

## 2021-12-22 NOTE — Progress Notes (Signed)
ANTICOAGULATION CONSULT NOTE Pharmacy Consult for Heparin Indication: atrial fibrillation  No Known Allergies  Patient Measurements: Height: 5\' 3"  (160 cm) Weight: 54.3 kg (119 lb 11.4 oz) IBW/kg (Calculated) : 52.4 Heparin Dosing Weight: 47.7 kg  Vital Signs: Temp: 98.6 F (37 C) (01/16 1542) Temp Source: Oral (01/16 1542) BP: 92/68 (01/16 1300) Pulse Rate: 98 (01/16 1529)  Labs: Recent Labs    12/21/21 0311 12/21/21 0418 12/21/21 1426 12/21/21 2115 12/22/21 0319 12/22/21 1036 12/22/21 1146 12/22/21 1200  HGB 7.5*   < > 7.6*   < > 7.7* 9.5* 9.2*  --   HCT 23.9*   < > 24.7*   < > 24.9* 28.0* 27.0*  --   PLT 145*  --  160  --  190  --   --   --   HEPARINUNFRC 0.56  --   --   --  0.75*  --   --  0.63  CREATININE 2.47*  --  2.40*  --  2.22*  --   --   --    < > = values in this interval not displayed.     Estimated Creatinine Clearance: 19.5 mL/min (A) (by C-G formula based on SCr of 2.22 mg/dL (H)).   Assessment: 71 y.o. female on heparin for a fib.   Heparin level therapeutic at 0.63 at upper end of range on 1450 units/hr today.  Hemoglobin stable 7-9 no s/s bleeding platelets stable.     Goal of Therapy:  Heparin level 0.3-0.7 units/ml Monitor platelets by anticoagulation protocol: Yes   Plan:  Decrease heparin to 1400 units/hr Daily heparin level and CBC  Bonnita Nasuti Pharm.D. CPP, BCPS Clinical Pharmacist [phone] 12/22/2021 4:11 PM

## 2021-12-22 NOTE — Progress Notes (Signed)
RT assisted with transportation of this pt from 2H23 to MRI while on full ventilatory support. Pt tolerated well with SVS and no complications. RT will continue to monitor pt.

## 2021-12-22 NOTE — Progress Notes (Signed)
NAME:  Mary Avila, MRN:  [id number], DOB:  08-26-1951, LOS: 7 ADMISSION DATE:  12/26/2021 CONSULTATION DATE:  12/19/2021 REFERRING MD:  Reather Converse - EDP CHIEF COMPLAINT:  Post-PEA arrest   History of Present Illness:  71 year old woman who presented to [facility] ED 1/9 via EMS after PEA arrest at facility.  Per report it appears patient had a prolonged respiratory arrest at facility with brief PEA cardiac arrest when EMS arrived. CPR performed x 8 minutes with Epi x 1 administered and ROSC.  Given GCS 3 on arrival patient was intubated for airway protection. CTA Chest was obtained to r/o PE; negative for PE but demonstrated significant air-fluid level with mild-moderate thick-walled cavitary lesion of the superior RUL with findings concerning for multifocal infection.  PCCM was consulted for admission.  Pertinent Medical History:   Past Medical History:  Diagnosis Date   Bipolar disorder ([place])    COPD (chronic obstructive pulmonary disease) ([place])    Osteopenia    Personality disorder ([place])    Schizoaffective disorder    Weakness    Significant Hospital Events: Including procedures, antibiotic start and stop dates in addition to other pertinent events   1/9 - OOH arrest at facility, LKN 1330. Found down 1430 with agonal respirations. EMS called and patient found to be in PEA. CPR x 8 mins, 1 Epi with ROSC. Epi gtt via R tibial IO for hypotension. GCS 3 on arrival. Intubated in ED. CTA without PE, +large RUL cavitary PNA superimposed on COPD. Empiric Zosyn/Vanc/Flagyl.  1/10 underwent bedside bronchoscopy for BAL 1/11 rapid A. fib with RVR overnight requiring amiodarone infusion 1/12 weaning amio to 30 -- back in NSR. Weaning pressors.  1/13 in and out of Afib overnight. Hgb down to 7.2. Cr incr to 2.13 , drop PEEP to 5, airborne isolation DC'd 1/14 copious secretions, hypoxia, PEEP added , 1 unit PRBC for hemoglobin 6.7  Interim History / Subjective:   Off sedation today, patient appears  severely dyspneic with significant respiratory muscle use.    Objective:  Blood pressure 129/83, pulse (!) 103, temperature 97.6 F (36.4 C), temperature source Axillary, resp. rate (!) 24, height _0  (1.6 m), weight 54.3 kg, SpO2 94 %.    Vent Mode: PRVC FiO2 (%):  [60 %-70 %] 60 % Set Rate:  [24 bmp-32 bmp] 32 bmp Vt Set:  [310 mL-410 mL] 310 mL PEEP:  [6 cmH20-8 cmH20] 8 cmH20 Plateau Pressure:  [14 cmH20-24 cmH20] 14 cmH20   Intake/Output Summary (Last 24 hours) at 12/22/2021 1121 Last data filed at 12/22/2021 0800 Gross per 24 hour  Intake 4544.55 ml  Output 1450 ml  Net 3094.55 ml    Filed Weights   12/16/21 0359 12/18/21 0600 12/19/21 0500  Weight: 47.7 kg 55.8 kg 54.3 kg   Physical Examination: General: Critically and chronically ill appearing older than stated age.  Cachectic.  Intubated, on sedation and vasopressors.   HEENT: NCAT Pink mm Anicteric sclera ETT secure  Neuro: Does not follow commands.  No spontaneous movement. CV: RRR s1s2 cap refill < 3 sec  PULM: Signs of hyperinflation with marked tracheal tug.  Clear breath sounds and crackles on right, severe accessory muscle use with decreased sedation.  With respiratory rate at 33 patient had 20 of auto PEEP GI: soft ndnt + bowel sounds  Extremities: Diffuse edema no cyanosis or clubbing  Skin: pale, moist, scattered ecchymosis   Point-of-care echo personally performed showing normal LV systolic function moderately enlarged RV with estimated RVSP  around 40.  IVC is dilated with limited respiratory variation  Ancillary tests personally reviewed:  BG shows persistent hypercarbia Creatinine stable 2.20 WBC 15.5 hemoglobin 9.5. Chest x-ray shows bilateral interstitial lung disease with opacification of right middle lobe consistent with pneumonia. Tracheal aspirate positive for Staph aureus sensitive to  cefazolin Assessment & Plan:   Acute metabolic encephalopathy superimposed on underlying Bipolar disorder,  Schizoaffective disorder  Acute on chronic respiratory failure with hypoxemia and hypercarbia COPD with severe emphysema Acute respiratory acidosis 1/15 RUL Cavitary PNA MSSA PNA OOH PEA Arrest  Critically ill due to septic Shock due to MSSA PNA  New onset Afib RVR -now maintaining normal sinus rhythm Combined systolic and diastolic HF  AKI  Hyperkalemia  Acute on chronic anemia Protein calorie malnutrition  Hyperglycemia  Plan:  -For MRI today to help with neuro prognostication. -Limited neurological recovery and patient unable to tolerate discontinuation of sedation due to respiratory distress .-Severe underlying lung disease with evidence of pulmonary hypertension. -Trial of diuresis today, continue to wean vasopressors to keep MAP greater than 65 -Wean steroids. -Minimize sedation -Continue tube feeds at goal.  Avoid overfeeding given poor underlying lung function.  -Overall prognosis is poor given lack of neurological improvement now 7 days from event with evidence of poor baseline underlying health.  We will discuss goals of care with patient's decision maker as showing signs of inability to recover to previous or acceptable quality of life.  Best Practice: (right click and "Reselect all SmartList Selections" daily)   Diet/type: EN DVT prophylaxis: SCD, heparin infusion  GI prophylaxis: PPI Lines: CVC Foley:  Yes, and it is still needed Code Status:  full code Last date of multidisciplinary goals of care discussion: Legal guardian  1/15 Gildardo Pounds fully updated to current status, poor prognosis given vent dependence and poor neurological status. Dr Elsworth Soho recommended care limitations, she will discuss with her staff colleagues    Critical care time:    CRITICAL CARE Performed by: Kipp Brood   Total critical care time:45 minutes  Critical care time was exclusive of separately billable procedures and treating other patients. Critical care was necessary to treat or  prevent imminent or life-threatening deterioration.  Critical care was time spent personally by me on the following activities: development of treatment plan with patient and/or surrogate as well as nursing, discussions with consultants, evaluation of patient's response to treatment, examination of patient, obtaining history from patient or surrogate, ordering and performing treatments and interventions, ordering and review of laboratory studies, ordering and review of radiographic studies, pulse oximetry and re-evaluation of patient's condition.  Kipp Brood, MD [facility] ICU Physician [place]  Pager: [phone] Or Epic Secure Chat After hours: [phone].  12/22/2021, 11:34 AM

## 2021-12-22 NOTE — Progress Notes (Signed)
ANTICOAGULATION CONSULT NOTE Pharmacy Consult for Heparin Indication: atrial fibrillation  No Known Allergies  Patient Measurements: Height: 5\' 3"  (160 cm) Weight: 54.3 kg (119 lb 11.4 oz) IBW/kg (Calculated) : 52.4 Heparin Dosing Weight: 47.7 kg  Vital Signs: Temp: 99.5 F (37.5 C) (01/16 0353) Temp Source: Axillary (01/16 0353) BP: 108/76 (01/16 0322) Pulse Rate: 93 (01/16 0322)  Labs: Recent Labs    12/20/21 1634 12/20/21 2139 12/21/21 0311 12/21/21 0418 12/21/21 1426 12/21/21 2115 12/22/21 0319  HGB  --    < > 7.5*   < > 7.6* 9.2* 7.7*  HCT  --    < > 23.9*   < > 24.7* 27.0* 24.9*  PLT  --   --  145*  --  160  --  190  HEPARINUNFRC 0.51  --  0.56  --   --   --  0.75*  CREATININE  --   --  2.47*  --  2.40*  --  2.22*   < > = values in this interval not displayed.     Estimated Creatinine Clearance: 19.5 mL/min (A) (by C-G formula based on SCr of 2.22 mg/dL (H)).   Assessment: 71 y.o. female on heparin for a fib. Heparin level therapeutic at 0.56 on 1650 units/hr today. Level was also therapeutic last night at 0.51. Can move to daily monitoring.  Hemoglobin improved to  7.5 today platelets stable at 145. No bleeding noted per RN.   Patient had bleeding at a prior access site, this is "Weepy" per RN, but improved from yesterday. Will continue to monitor.  1/16 AM update:  Heparin level elevated   Goal of Therapy:  Heparin level 0.3-0.7 units/ml Monitor platelets by anticoagulation protocol: Yes   Plan:  Dec heparin to 1450 units/hr 1200 heparin level Watch Hgb  Narda Bonds, PharmD, BCPS Clinical Pharmacist Phone: [phone]

## 2021-12-23 ENCOUNTER — Inpatient Hospital Stay (HOSPITAL_COMMUNITY): Payer: Medicare Other

## 2021-12-23 DIAGNOSIS — Z789 Other specified health status: Secondary | ICD-10-CM

## 2021-12-23 DIAGNOSIS — A4101 Sepsis due to Methicillin susceptible Staphylococcus aureus: Secondary | ICD-10-CM | POA: Diagnosis not present

## 2021-12-23 DIAGNOSIS — I469 Cardiac arrest, cause unspecified: Secondary | ICD-10-CM | POA: Diagnosis not present

## 2021-12-23 LAB — BASIC METABOLIC PANEL
Anion gap: 17 — ABNORMAL HIGH (ref 5–15)
BUN: 119 mg/dL — ABNORMAL HIGH (ref 8–23)
CO2: 27 mmol/L (ref 22–32)
Calcium: 7.5 mg/dL — ABNORMAL LOW (ref 8.9–10.3)
Chloride: 97 mmol/L — ABNORMAL LOW (ref 98–111)
Creatinine, Ser: 2.21 mg/dL — ABNORMAL HIGH (ref 0.44–1.00)
GFR, Estimated: 23 mL/min — ABNORMAL LOW (ref >60)
Glucose, Bld: 182 mg/dL — ABNORMAL HIGH (ref 70–99)
Potassium: 5.7 mmol/L — ABNORMAL HIGH (ref 3.5–5.1)
Sodium: 141 mmol/L (ref 135–145)

## 2021-12-23 LAB — CBC
HCT: 26.1 % — ABNORMAL LOW (ref 36.0–46.0)
Hemoglobin: 8.3 g/dL — ABNORMAL LOW (ref 12.0–15.0)
MCH: 30.7 pg (ref 26.0–34.0)
MCHC: 31.8 g/dL (ref 30.0–36.0)
MCV: 96.7 fL (ref 80.0–100.0)
Platelets: 237 10*3/uL (ref 150–400)
RBC: 2.7 MIL/uL — ABNORMAL LOW (ref 3.87–5.11)
RDW: 20.1 % — ABNORMAL HIGH (ref 11.5–15.5)
WBC: 19.8 10*3/uL — ABNORMAL HIGH (ref 4.0–10.5)
nRBC: 1.3 % — ABNORMAL HIGH (ref 0.0–0.2)

## 2021-12-23 LAB — PHOSPHORUS: Phosphorus: 4.5 mg/dL (ref 2.5–4.6)

## 2021-12-23 LAB — GLUCOSE, CAPILLARY
Glucose-Capillary: 199 mg/dL — ABNORMAL HIGH (ref 70–99)
Glucose-Capillary: 182 mg/dL — ABNORMAL HIGH (ref 70–99)
Glucose-Capillary: 187 mg/dL — ABNORMAL HIGH (ref 70–99)

## 2021-12-23 LAB — MAGNESIUM: Magnesium: 2.1 mg/dL (ref 1.7–2.4)

## 2021-12-23 LAB — HEPARIN LEVEL (UNFRACTIONATED): Heparin Unfractionated: 0.37 IU/mL (ref 0.30–0.70)

## 2021-12-23 MED ORDER — MIDAZOLAM BOLUS VIA INFUSION (WITHDRAWAL LIFE SUSTAINING TX)
2.0000 mg | INTRAVENOUS | Status: DC | PRN
  Filled 2021-12-23: qty 2

## 2021-12-23 MED ORDER — MIDAZOLAM HCL 2 MG/2ML IJ SOLN: 1.0000 mg | INTRAMUSCULAR | Status: DC | PRN

## 2021-12-23 MED ORDER — GLYCOPYRROLATE 1 MG PO TABS
1.0000 mg | ORAL_TABLET | ORAL | Status: DC | PRN
  Filled 2021-12-23: qty 1

## 2021-12-23 MED ORDER — ACETAMINOPHEN 325 MG PO TABS: 650.0000 mg | ORAL_TABLET | Freq: Four times a day (QID) | ORAL | Status: DC | PRN

## 2021-12-23 MED ORDER — FENTANYL BOLUS VIA INFUSION
100.0000 ug | INTRAVENOUS | Status: DC | PRN
  Filled 2021-12-23: qty 100

## 2021-12-23 MED ORDER — POLYVINYL ALCOHOL 1.4 % OP SOLN
1.0000 [drp] | Freq: Four times a day (QID) | OPHTHALMIC | Status: DC | PRN
  Filled 2021-12-23: qty 15

## 2021-12-23 MED ORDER — FENTANYL CITRATE PF 50 MCG/ML IJ SOSY: 50.0000 ug | PREFILLED_SYRINGE | INTRAMUSCULAR | Status: DC | PRN

## 2021-12-23 MED ORDER — MIDAZOLAM-SODIUM CHLORIDE 100-0.9 MG/100ML-% IV SOLN
0.0000 mg/h | INTRAVENOUS | Status: DC
  Administered 2021-12-23: 2 mg/h via INTRAVENOUS
  Filled 2021-12-23: qty 100

## 2021-12-23 MED ORDER — DEXTROSE 5 % IV SOLN: INTRAVENOUS | Status: DC

## 2021-12-23 MED ORDER — DIPHENHYDRAMINE HCL 50 MG/ML IJ SOLN: 25.0000 mg | INTRAMUSCULAR | Status: DC | PRN

## 2021-12-23 MED ORDER — GLYCOPYRROLATE 0.2 MG/ML IJ SOLN: 0.2000 mg | INTRAMUSCULAR | Status: DC | PRN

## 2021-12-23 MED ORDER — ACETAMINOPHEN 650 MG RE SUPP: 650.0000 mg | Freq: Four times a day (QID) | RECTAL | Status: DC | PRN

## 2021-12-23 MED ORDER — FENTANYL 2500MCG IN NS 250ML (10MCG/ML) PREMIX INFUSION
0.0000 ug/h | INTRAVENOUS | Status: DC
  Administered 2021-12-23: 250 ug/h via INTRAVENOUS

## 2022-01-07 LAB — MAC SUSCEPTIBILITY BROTH
Amikacin: 4
Ciprofloxacin: 8
Clarithromycin: 1
Doxycycline: 8
Minocycline: 8
Moxifloxacin: 1
Rifabutin: 0.25
Rifampin: 1
Streptomycin: 8

## 2022-01-07 LAB — AFB ORGANISM ID BY DNA PROBE
M avium complex: POSITIVE — AB
M tuberculosis complex: NEGATIVE

## 2022-01-07 LAB — ACID FAST CULTURE WITH REFLEXED SENSITIVITIES (MYCOBACTERIA): Acid Fast Culture: POSITIVE — AB

## 2022-01-07 NOTE — Progress Notes (Signed)
Per CCM order, pt was terminally extubated to room air comfort measures. RN at bedside.

## 2022-01-07 NOTE — Discharge Summary (Signed)
DEATH SUMMARY   Patient Details  Name: Mary Avila MRN: [id number] DOB: 04/24/1951  Admission/Discharge Information   Admit Date:  12/08/2021  Date of Death: Date of Death: 01/06/2022  Time of Death: Time of Death: 1418  Length of Stay: 8  Referring Physician: Pcp, No   Reason(s) for Hospitalization  PEA cardiac arrest  Diagnoses  Preliminary cause of death:   Pneumonia due to methicillin sensitive Staph aureus Secondary Diagnoses (including complications and co-morbidities):  Principal Problem:   Cardiac arrest (HCC) Active Problems:   Septic shock (HCC)   Protein-calorie malnutrition, severe   Difficult intravenous access COPD Schizoaffective disorder Bipolar affective disorder  Brief Hospital Course (including significant findings, care, treatment, and services provided and events leading to death)  Mary Avila is a 71 y.o. year old female who lives in a group home suffering from longstanding psychiatric disorder.  She had a longstanding smoking history but without documented COPD. She was found unresponsive at her nursing home.  She apparently had a respiratory arrest followed by PEA arrest and received 8 minutes of CPR. On arrival she was intubated.  CT scan showed a right lung complex cavitary lesion which eventually grew Staph aureus consistent with cavitary pneumonia.  She remained mechanically ventilated on high FiO2 and PEEP settings and continues to require vasopressors.  We were unable to wean her sedation without respiratory distress and dyssynchrony.  An MRI of her brain revealed multifocal strokes.  Her condition was discussed with her legally appointed guardian.  Given her prolonged critical illness, lack of neurological improvement and unfavorable appearing MRI it was felt that the patient would not recover to her previous state of health and would at best be fully dependent on others for care and likely not survive this hospitalization irrespective of  continued aggressive care.  It was therefore decided to transition her to comfort care and she was compassionately extubated.    Pertinent Labs and Studies  Significant Diagnostic Studies CT HEAD WO CONTRAST (<MEASUREMShaune PoGreenlLaviRBaxterLowella BanKentuckydCasa GrandesouthweSansum Clinic Dba Foothill Surgery CenterDelorise 6<MEASUREMENShaune PoGreenlLaviRBaxterLowella BanKentuckydEvans MYavapai RegionDelorise 6<MEASUREMENShaune PoGreenlLaviRBaxterLowella BanKentuckydIndiana University HealBig Sky SDelorise [id number]<MEASUREMENShaune PoGreenlLaviRBaxterLowella BanKentuckydT JWarm Springs Rehabilitation HospitDelorise 6<MEASUREMENShaune PoGreenlLaviRBaxterLowella BanKentuckydTexas OrtBell Delorise 6<MEASUREMENShaune PoGreenlLaviRBaxterLowella BanKentuckydAlliance SpecialtyWoodbridge DevDelorise 6<MEASUREMENShaune PoGreenlLaviRBaxterLowella BanKentuckydRobert J. Dole VMemorial Hospital Of Delorise 69629yals<MEASUREMENShaune PoGreenlLaviRBaxterLowella BanKentuckydCommunity Saint Lukes GDelorise 6<MEASUREMENShaune PoGreenlLaviRBaxterLowella BanKentuckydMc Donough Delorise 6<MEASUREMENShaune PoGreenlLaviRBaxterLowella BanKentuckydValley Outpatient SurLDelorise 6<MEASUREMENShaune PoGreenlLaviRBaxterLowella BanKentuckydLittle River MNortheast RehabiDelorise 6<MEASUREMENShaune PoGreenlLaviRBaxterLowella BanKentuckydGrand IslanSummitDelorise 6<MEASUREMENShaune PoGreenlLaviRBaxterLowella BanKentuckydSimpson [facility] Hospital Of SoDelorise [id number] LoWarde<MEASUREMENShaune PoGreenlLaviRBaxterLowella BanKentuckydNorth Mississippi Ambulatory SuCaseDelorise 6<MEASUREMENShaune PoGreenlLaviRBaxterLowella BanKentuckydSouthcoast BResurgens Fayette SDelorise 6<MEASUREMENShaune PoGreenlLaviRBaxterLowella BanKentuckydPresence Chicago Hospitals Network Dba Presence ResurrectioManalapan SDelorise 6<MEASUREMENShaune PoGreenlLaviRBaxterLowella BanKentuckydHarris Health System Ben Taub Metro Specialty SDelorise [address]9629yals<MEASUREMENShaune PoGreenlLaviRBaxterLowella BanKentuckydBellin HealthLindsborg CDelorise [address]yals<MEASUREMENShaune PoGreenlLaviRBaxterLowella BanKentuckydThe University Of [facility] Health Network Elizabethtown Moses LuSiDelorise 6<MEASUREMENShaune PoGreenlLaviRBaxterLowella BanKentuckydBridgepointSpringfield Hospital Inc - Dba Lincoln Prairie BehavioDelorise 6<MEASUREMENShaune PoGreenlLaviRBaxterLowella BanKentuckydChathaHodgeman CouDelorise 6<MEASUREMENShaune PoGreenlLaviRBaxterLowella BanKentuckydCrawley Delorise 6[address]yals<MEASUREMENShaune PoGreenlLaviRBaxterLowella BanKentuckydNebraska SpiWestern Avenue Day Surgery Center Dba Division Of Plastic And HaDelorise 6<MEASUREMENShaune PoGreenlLaviRBaxterLowella BanKentuckydThe Center For OrthopeMadisoDelorise [address]yalsh HWarden/rangertal status EXAM: CT HEAD WITHOUT CONTRAST TECHNIQUE: Contiguous axial images were obtained from the base of the skull through the vertex without intravenous contrast. COMPARISON:  03/17/2016 FINDINGS: Brain: No convincing acute infarct when accounting for streak artifact especially affecting the infratentorial and posterior brain. No hemorrhage, hydrocephalus, extra-axial collection or mass lesion/mass effect. Vascular: No focal hyperdense vessel or unexpected calcification. Skull: Normal. Negative for fracture or focal lesion. Sinuses/Orbits: No acute finding. IMPRESSION: No convincing acute process.  No evidence of global anoxic injury. Electronically Signed   By: Jonathan  Watts M.D.   On: 12/16/2021 06:07   CT Angio Chest PE W and/or Wo Contrast  Result Date: 01/03/2022 CLINICAL DATA:  Last seen normal at 13:30 hours. Post cardiac arrest. Approximally down time of 1 hour. Status post CPR by EMS. Evaluate for pulmonary embolism. EXAM: CT ANGIOGRAPHY CHEST WITH CONTRAST TECHNIQUE: Multidetector CT imaging of the chest was performed using the standard protocol during bolus administration of intravenous contrast. Multiplanar CT image reconstructions and MIPs were obtained to evaluate the vascular anatomy. CONTRAST:  [id number]mSwazi17mSwazi76mSwazi70mSwazilanFreeman CaldronXOL 350 MG/ML SOLN COMPARISON:  AP chest 26-Mar-2022 03/17/2016 FINDINGS: There is an endotracheal tube with tip terminating approximately 1.6 cm above the carina. As stated on prior AP chest radiograph, this can be retracted approximately 2-3 cm. An enteric tube descends below the diaphragm with the tip terminating within the proximal stomach. This also can be advanced approximately 10  cm  for the side port to be within the stomach. A likely esophageal probe terminates within the distal esophagus. Cardiovascular: Satisfactory opacification of the pulmonary arteries to the segmental level. There is attenuation of the pulmonary vessels within the segmental level of the bilateral upper lobes, likely from a combination of emphysematous change and the below described right upper lung cavitary lesion. Heart size is at the upper limits of normal. No pericardial effusion. The ascending aorta measures up to 3.4 cm, ectatic but not aneurysmal. Mild atherosclerotic calcifications within the thoracic aorta. Mediastinum/Nodes: 1.2 cm mildly enlarged right hilar lymph node (axial series 5, image 75). No definite enlarged mediastinal lymph nodes. No axillary lymphadenopathy by CT criteria. Lungs/Pleura: The central airways are patent. There is diffuse high-grade centrilobular emphysematous change. There is right greater than left mid and lower lung interlobular septal thickening. There is moderate to high-grade consolidation of the posterior aspect of the right lower lobe. There is an apparent cavitary cystic lesion within the superior 40% of the right lung, measuring up to approximately 8.6 cm in craniocaudal dimension and involving the majority of the transverse dimension of the lung with an air-fluid level and fluid layering within the dependent 25% of this cavitary region. There is mild to moderate thickening of the pleura of the superior 50% of the right lung, measuring up to proximally 3 mm in transverse thickness. There is curvilinear scarring within the anterior left upper lobe. Mild-to-moderate consolidation within the inferior lingula. Upper Abdomen: No acute abnormality. Musculoskeletal: No chest wall abnormality. No acute or significant osseous findings. Review of the MIP images confirms the above findings. IMPRESSION:: IMPRESSION: 1. No acute pulmonary embolism is seen. 2. There is an air-fluid level  within a mildly to moderately thick-walled large cavitary lesion within the superior aspect of the right upper lobe, as also visualized on radiograph earlier today. There is also diffuse moderate consolidation of the mid to posterior aspect of the right lower lobe and inferior lingula. Findings are suspicious for multifocal infection. Both bacterial and fungal/mycobacterial infections are considered. 3. Moderate to high-grade centrilobular emphysema. 4. Endotracheal tube tip again terminates approximately 1.6 cm above the carina. Consider retraction. 5. Enteric tube tip again terminates within the proximal stomach. Consider advancement. Electronically Signed   By: Neita Garnet   On: 12/11/2021 17:53   MR BRAIN WO CONTRAST  Result Date: 12/22/2021 CLINICAL DATA:  Mental status change, unknown cause. Additional history provided: Patient admitted for PA arrest, found unresponsive with agonal breathing at independent living center, patient had reported breathing issues related the same day, CPR performed by EMS for 8 minutes, subsequent ROSC. EXAM: MRI HEAD WITHOUT CONTRAST TECHNIQUE: Multiplanar, multiecho pulse sequences of the brain and surrounding structures were obtained without intravenous contrast. COMPARISON:  Prior head CT examinations 12/16/2021 and earlier. FINDINGS: Brain: Intermittently motion degraded examination. Most notably, there is moderate/severe motion degradation of the sagittal T1 weighted sequence. Acute cortical/subcortical infarct within the anterolateral left frontal lobe, measuring 3.1 x 1.3 x 2.2 cm (series 5, image 72) (series 7, image 64). Elsewhere, there are numerous small foci of cortical/subcortical restricted diffusion within the bilateral cerebral hemispheres, many of which are in a watershed distribution. 3 mm focus of restricted diffusion within the midline callosal splenium. 9 mm acute infarct within the left caudate head. There is background more vague symmetric  diffusion-weighted signal abnormality within the bilateral basal ganglia and hippocampi. Additionally, there are multiple small patchy foci of restricted diffusion and T2 FLAIR hyperintense signal abnormality within  the bilateral cerebral hemispheres. No evidence of an intracranial mass. No extra-axial fluid collection. No midline shift. Vascular: Maintained flow voids within the proximal large arterial vessels. Skull and upper cervical spine: No focal suspicious marrow lesion is identified. Incompletely assessed cervical spondylosis. Sinuses/Orbits: Visualized orbits show no acute finding. Small mucous retention cyst within the left sphenoid sinus. Other: Large bilateral mastoid effusions. IMPRESSION: Intermittently motion degraded examination. 3.1 x 2.2 cm acute cortical/subcortical infarct within the anterolateral left frontal lobe (left MCA vascular territory). Numerous small foci of cortical/subcortical restricted diffusion elsewhere within the bilateral cerebral hemispheres, many of which are in a watershed distribution. These foci likely reflect a combination of watershed/embolic infarcts and acute hypoxic/ischemic injury. 3 mm focus of restricted diffusion within the midline callosal splenium, which may reflect a small acute infarct or acute hypoxic/ischemic injury. Symmetric diffusion-weighted signal abnormality within the bilateral basal ganglia and hippocampi compatible with acute hypoxic/ischemic injury. Superimposed 9 mm acute infarct within the left caudate nucleus. Multiple small patchy foci of restricted diffusion and T2 FLAIR hyperintense signal abnormality within the bilateral cerebellar white matter, which may reflect acute infarcts and/or acute hypoxic/ischemic injury. Electronically Signed   By: Kellie Simmering D.O.   On: 12/22/2021 15:46   DG Chest Port 1 View  Result Date: 2022/01/12 CLINICAL DATA:  Central line placement. EXAM: PORTABLE CHEST 1 VIEW COMPARISON:  12/21/2021 FINDINGS: RIGHT IJ  central line tip overlies the superior vena cava. LEFT IJ central line has been withdrawn, tip overlying the LEFT lung apex. Endotracheal tube is in place, tip approximately 5 centimeters above the carina. Feeding tube is in place, tip in the stomach. Heart size is normal. Lungs are hyperinflated. Coarse opacities are identified throughout the RIGHT lung and at the LEFT lung base, similar in appearance to prior study. No pneumothorax following line placement. IMPRESSION: Interval placement of RIGHT IJ central line in repositioned LEFT IJ line. No pneumothorax. Electronically Signed   By: Nolon Nations M.D.   On: January 12, 2022 10:50   DG Chest Port 1 View  Result Date: 12/21/2021 CLINICAL DATA:  Encounter for acute respiratory failure EXAM: PORTABLE CHEST 1 VIEW COMPARISON:  Two days ago FINDINGS: Feeding tube which at least reaches the stomach, interval. Left IJ line with tip at the SVC. Endotracheal tube with tip between the clavicular heads and carina. Extensive cavitary pneumonia superimposed on emphysema. Worsening aeration at the right base where there is likely lower lobe collapse. No visible pneumothorax. Normal heart size. IMPRESSION: 1. Unremarkable hardware. 2. Multifocal pneumonia with known cavitation on the right. Worsening right lower lobe aeration. Electronically Signed   By: Jorje Guild M.D.   On: 12/21/2021 08:08   DG CHEST PORT 1 VIEW  Result Date: 12/19/2021 CLINICAL DATA:  Acute on chronic respiratory failure with hypoxia EXAM: PORTABLE CHEST 1 VIEW COMPARISON:  12/16/2021 FINDINGS: Endotracheal tube, enteric tube, and left IJ central line are again identified. The enteric tube tip remains at the gastroesophageal junction. Advanced emphysematous changes. Cavitary lesion of the upper right lung is again identified. Persistent patchy opacification mid to lower right lung. Persistent patchy opacification left mid and lower lung. IMPRESSION: Stable lines and tubes. Enteric tube tip is  again near the gastroesophageal junction. Similar lung aeration with COPD, pneumonia, and chronic cavitation. Electronically Signed   By: Macy Mis M.D.   On: 12/19/2021 08:16   DG Chest Port 1 View  Result Date: 12/16/2021 CLINICAL DATA:  Right upper lobe pneumonia. Endotracheal tube present. Cardiac arrest. Code sepsis. EXAM:  PORTABLE CHEST 1 VIEW COMPARISON:  AP chest 12/16/2021 at 1207 hours, CTA chest 01/01/2022, AP chest FINDINGS: Endotracheal tube tip terminates approximately 3.4 cm above the carina. Enteric tube descends below the diaphragm with the tip excluded by collimation, likely advanced from prior. A left internal jugular central venous catheter tip again overlies the superior vena cava/right atrial junction. Cardiac silhouette and mediastinal contours are unchanged and within normal limits with calcification again seen within aortic arch. No significant change in heterogeneous airspace opacification within the right mid and lower lung. A cavitary lesion is again seen within the upper third of the right lung. More mild left perihilar interstitial thickening. No definite pleural effusion. Bilateral moderate to high-grade centrilobular emphysematous changes. No pneumothorax is seen. No acute skeletal abnormality. IMPRESSION: No significant change from prior. COPD with right mid and lower lung pneumonia and right upper lung likely chronic cavitary component. Support apparatus in appropriate position. Electronically Signed   By: Yvonne Kendall   On: 12/16/2021 08:19   DG CHEST PORT 1 VIEW  Result Date: 12/16/2021 CLINICAL DATA:  ET tube, central line placement EXAM: PORTABLE CHEST 1 VIEW COMPARISON:  12/26/2021 FINDINGS: Endotracheal tube is 3 cm above the carina. NG tube tip is in the distal esophagus or just into the stomach near the GE junction. This is unchanged. Heart is normal size. Underlying COPD. Consolidation seen in the right mid and lower lung, progressive since prior study  concerning for worsening pneumonia. Chronic changes in the left lung. Suspect small right effusion. No acute bony abnormality. IMPRESSION: COPD. Worsening consolidation in the right mid and lower lung concerning for pneumonia. Support devices as above. Electronically Signed   By: Rolm Baptise M.D.   On: 12/16/2021 00:20   DG Chest Portable 1 View  Result Date: 12/17/2021 CLINICAL DATA:  Status post cardiac arrest, difficulty breathing EXAM: PORTABLE CHEST 1 VIEW COMPARISON:  03/17/2016 FINDINGS: Transverse diameter of heart is increased. Increased interstitial markings are seen in both parahilar regions and both lower lung fields, more so on the right side. There is honeycombing in the right mid and right lower lung fields. There is patchy infiltrate in the right upper lobe with possible central cavitation. Tip of endotracheal tube is 1 cm above the carina. Tip of enteric tube is seen in the medial fundus of the stomach. Side port in the NG tube is seen in the lower thoracic esophagus. IMPRESSION: COPD. Increased interstitial markings are seen in the parahilar regions and lower lung fields on both sides, more so on the right side. This may suggest asymmetric pulmonary edema or interstitial pneumonia. Honeycombing is seen in the right lower lung fields suggesting possible pulmonary fibrosis. There is patchy infiltrate in the right upper lobe with possible cavitation in the lateral aspect of the infiltrate. This may suggest necrotizing pneumonia or necrotic neoplastic process. Tip of endotracheal tube is 1 cm from the carina and should be pulled back 2-3 cm. Side-port in the enteric tube is seen in the lower thoracic esophagus. Enteric tube should be advanced approximately 10 cm to place the side port within the stomach. These results will be called to the ordering clinician or representative by the Radiologist Assistant, and communication documented in the PACS or [employer]. Electronically Signed   By:  Elmer Picker M.D.   On: 12/30/2021 16:20   DG Abd Portable 1V  Result Date: 12/19/2021 CLINICAL DATA:  Reason for exam: Encounter for feeding tube placement EXAM: PORTABLE ABDOMEN - 1 VIEW COMPARISON:  12/30/2021 FINDINGS: Replacement of of NG tube with a feeding tube. Weighted tip in the gastric antral region. IMPRESSION: Feeding tube with tip in distal stomach. Electronically Signed   By: Suzy Bouchard M.D.   On: 12/19/2021 15:16   DG Abd Portable 1 View  Result Date: 12/12/2021 CLINICAL DATA:  Orogastric tube placement EXAM: PORTABLE ABDOMEN - 1 VIEW COMPARISON:  Portable exam 1622 hours without priors for comparison FINDINGS: Tip of orogastric tube projects over stomach with the proximal side-port projects over the distal esophagus, recommend advancing tube 6 cm. Air-filled bowel loops throughout abdomen. Increased stool RIGHT colon. No bowel wall thickening. Bones demineralized. IMPRESSION: Recommend advancing nasogastric tube 6 cm. Air-filled bowel loops throughout abdomen. Electronically Signed   By: Lavonia Dana M.D.   On: 12/26/2021 16:35   ECHOCARDIOGRAM COMPLETE  Result Date: 12/16/2021    ECHOCARDIOGRAM REPORT   Patient Name:   Chalmers Cater Date of Exam: 12/16/2021 Medical Rec #:  [id number]        Height:       63.0 in Accession #:    [id number]       Weight:       105.2 lb Date of Birth:  May 28, 1951         BSA:          1.471 m Patient Age:    81 years         BP:           91/79 mmHg Patient Gender: F                HR:           93 bpm. Exam Location:  Inpatient Procedure: 2D Echo Indications:    Elevated troponin  History:        Patient has no prior history of Echocardiogram examinations.                 COPD.  Sonographer:    Jefferey Pica Referring Phys: [place]  1. Left ventricular ejection fraction, by estimation, is 50 to 55%. The left ventricle has low normal function. The left ventricle has no regional wall motion abnormalities. Left  ventricular diastolic parameters are consistent with Grade I diastolic dysfunction (impaired relaxation). There is the interventricular septum is flattened in systole, consistent with right ventricular pressure overload.  2. Right ventricular systolic function is normal. The right ventricular size is normal. There is severely elevated pulmonary artery systolic pressure.  3. The mitral valve is normal in structure. Trivial mitral valve regurgitation. No evidence of mitral stenosis.  4. Tricuspid valve regurgitation is moderate.  5. The aortic valve is tricuspid. There is mild calcification of the aortic valve. There is mild thickening of the aortic valve. Aortic valve regurgitation is trivial. Aortic valve sclerosis/calcification is present, without any evidence of aortic stenosis. Conclusion(s)/Recommendation(s): Findings consistent with Cor Pulmonale. FINDINGS  Left Ventricle: Left ventricular ejection fraction, by estimation, is 50 to 55%. The left ventricle has low normal function. The left ventricle has no regional wall motion abnormalities. The left ventricular internal cavity size was normal in size. There is no left ventricular hypertrophy. The interventricular septum is flattened in systole, consistent with right ventricular pressure overload. Left ventricular diastolic parameters are consistent with Grade I diastolic dysfunction (impaired relaxation). Normal left ventricular filling pressure. Right Ventricle: The right ventricular size is normal. No increase in right ventricular wall thickness. Right ventricular systolic function is normal. There is severely elevated  pulmonary artery systolic pressure. The tricuspid regurgitant velocity is 4.15 m/s, and with an assumed right atrial pressure of 10 mmHg, the estimated right ventricular systolic pressure is 123XX123 mmHg. Left Atrium: Left atrial size was normal in size. Right Atrium: Right atrial size was normal in size. Prominent Eustachian valve. Pericardium:  There is no evidence of pericardial effusion. Mitral Valve: The mitral valve is normal in structure. There is mild thickening of the mitral valve leaflet(s). Mild mitral annular calcification. Trivial mitral valve regurgitation. No evidence of mitral valve stenosis. Tricuspid Valve: The tricuspid valve is normal in structure. Tricuspid valve regurgitation is moderate . No evidence of tricuspid stenosis. Aortic Valve: The aortic valve is tricuspid. There is mild calcification of the aortic valve. There is mild thickening of the aortic valve. Aortic valve regurgitation is trivial. Aortic valve sclerosis/calcification is present, without any evidence of aortic stenosis. Aortic valve peak gradient measures 19.3 mmHg. Pulmonic Valve: The pulmonic valve was normal in structure. Pulmonic valve regurgitation is not visualized. No evidence of pulmonic stenosis. Aorta: The aortic root is normal in size and structure. Venous: IVC assessment for right atrial pressure unable to be performed due to mechanical ventilation. IAS/Shunts: There is redundancy of the interatrial septum. No atrial level shunt detected by color flow Doppler.  LEFT VENTRICLE PLAX 2D LVIDd:         3.90 cm   Diastology LVIDs:         2.50 cm   LV e' medial:    6.41 cm/s LV PW:         1.00 cm   LV E/e' medial:  9.5 LV IVS:        1.00 cm   LV e' lateral:   9.14 cm/s LVOT diam:     2.00 cm   LV E/e' lateral: 6.7 LV SV:         79 LV SV Index:   53 LVOT Area:     3.14 cm  RIGHT VENTRICLE             IVC RV Basal diam:  3.10 cm     IVC diam: 2.30 cm RV S prime:     13.00 cm/s TAPSE (M-mode): 2.0 cm LEFT ATRIUM             Index        RIGHT ATRIUM           Index LA diam:        2.80 cm 1.90 cm/m   RA Area:     12.70 cm LA Vol (A2C):   34.2 ml 23.24 ml/m  RA Volume:   28.30 ml  19.23 ml/m LA Vol (A4C):   30.2 ml 20.53 ml/m LA Biplane Vol: 34.9 ml 23.72 ml/m  AORTIC VALVE                  PULMONIC VALVE AV Area (Vmax): 2.08 cm      PV Vmax:       0.99  m/s AV Vmax:        219.50 cm/s   PV Peak grad:  3.9 mmHg AV Peak Grad:   19.3 mmHg LVOT Vmax:      145.00 cm/s LVOT Vmean:     102.000 cm/s LVOT VTI:       0.250 m  AORTA Ao Root diam: 3.20 cm Ao Asc diam:  3.20 cm MITRAL VALVE               TRICUSPID  VALVE MV Area (PHT): 3.76 cm    TR Peak grad:   68.9 mmHg MV Decel Time: 202 msec    TR Vmax:        415.00 cm/s MV E velocity: 61.20 cm/s MV A velocity: 79.00 cm/s  SHUNTS MV E/A ratio:  0.77        Systemic VTI:  0.25 m                            Systemic Diam: 2.00 cm Dani Gobble Croitoru MD Electronically signed by Sanda Klein MD Signature Date/Time: 12/16/2021/10:48:36 AM    Final     Microbiology Recent Results (from the past 240 hour(s))  Blood Culture (routine x 2)     Status: None   Collection Time: 01/02/2022  4:03 PM   Specimen: BLOOD  Result Value Ref Range Status   Specimen Description BLOOD RIGHT ANTECUBITAL  Final   Special Requests   Final    BOTTLES DRAWN AEROBIC AND ANAEROBIC Blood Culture adequate volume   Culture   Final    NO GROWTH 5 DAYS Performed at [facility], [address], [place], [place] [address]    Report Status 12/20/2021 FINAL  Final  Blood Culture (routine x 2)     Status: None   Collection Time: 12/27/2021  4:08 PM   Specimen: BLOOD LEFT FOREARM  Result Value Ref Range Status   Specimen Description BLOOD LEFT FOREARM  Final   Special Requests   Final    BOTTLES DRAWN AEROBIC AND ANAEROBIC Blood Culture results may not be optimal due to an inadequate volume of blood received in culture bottles   Culture   Final    NO GROWTH 5 DAYS Performed at [facility], [place] [address] [place], [place] [address]    Report Status 12/20/2021 FINAL  Final  Resp Panel by RT-PCR (Flu A&B, Covid) Nasopharyngeal Swab     Status: None   Collection Time: 12/19/2021  4:21 PM   Specimen: Nasopharyngeal Swab; Nasopharyngeal(NP) swabs in vial transport medium  Result Value Ref Range Status   SARS Coronavirus 2 by RT PCR  NEGATIVE NEGATIVE Final    Comment: (NOTE) SARS-CoV-2 target nucleic acids are NOT DETECTED.  The SARS-CoV-2 RNA is generally detectable in upper respiratory specimens during the acute phase of infection. The lowest concentration of SARS-CoV-2 viral copies this assay can detect is 138 copies/mL. A negative result does not preclude SARS-Cov-2 infection and should not be used as the sole basis for treatment or other patient management decisions. A negative result may occur with  improper specimen collection/handling, submission of specimen other than nasopharyngeal swab, presence of viral mutation(s) within the areas targeted by this assay, and inadequate number of viral copies(<138 copies/mL). A negative result must be combined with clinical observations, patient history, and epidemiological information. The expected result is Negative.  Fact Sheet for Patients:  EntrepreneurPulse.com.au  Fact Sheet for Healthcare Providers:  IncredibleEmployment.be  This test is no t yet approved or cleared by the [place] FDA and  has been authorized for detection and/or diagnosis of SARS-CoV-2 by FDA under an Emergency Use Authorization (EUA). This EUA will remain  in effect (meaning this test can be used) for the duration of the COVID-19 declaration under Section 564(b)(1) of the Act, 21 U.S.C.section 360bbb-3(b)(1), unless the authorization is terminated  or revoked sooner.       Influenza A by PCR NEGATIVE NEGATIVE Final  Influenza B by PCR NEGATIVE NEGATIVE Final    Comment: (NOTE) The Xpert Xpress SARS-CoV-2/FLU/RSV plus assay is intended as an aid in the diagnosis of influenza from Nasopharyngeal swab specimens and should not be used as a sole basis for treatment. Nasal washings and aspirates are unacceptable for Xpert Xpress SARS-CoV-2/FLU/RSV testing.  Fact Sheet for Patients: EntrepreneurPulse.com.au  Fact Sheet for  Healthcare Providers: IncredibleEmployment.be  This test is not yet approved or cleared by the [place] FDA and has been authorized for detection and/or diagnosis of SARS-CoV-2 by FDA under an Emergency Use Authorization (EUA). This EUA will remain in effect (meaning this test can be used) for the duration of the COVID-19 declaration under Section 564(b)(1) of the Act, 21 U.S.C. section 360bbb-3(b)(1), unless the authorization is terminated or revoked.  Performed at [facility], [place] [address] [place], [place] [address]   Urine Culture     Status: None   Collection Time: 12/26/2021  9:46 PM   Specimen: Urine, Random  Result Value Ref Range Status   Specimen Description URINE, RANDOM  Final   Special Requests NONE  Final   Culture   Final    NO GROWTH Performed at [facility], [place] [address], [place], [place] [address]    Report Status 12/16/2021 FINAL  Final  MRSA Next Gen by PCR, Nasal     Status: None   Collection Time: 01/03/2022  9:55 PM   Specimen: Nasal Mucosa; Nasal Swab  Result Value Ref Range Status   MRSA by PCR Next Gen NOT DETECTED NOT DETECTED Final    Comment: (NOTE) The GeneXpert MRSA Assay (FDA approved for NASAL specimens only), is one component of a comprehensive MRSA colonization surveillance program. It is not intended to diagnose MRSA infection nor to guide or monitor treatment for MRSA infections. Test performance is not FDA approved in patients less than 28 years old. Performed at [facility], [place] [address], [place], [place] [address]   Culture, Respiratory w Gram Stain     Status: None   Collection Time: 12/16/21 12:22 AM   Specimen: Tracheal Aspirate; Respiratory  Result Value Ref Range Status   Specimen Description TRACHEAL ASPIRATE  Final   Special Requests NONE  Final   Gram Stain   Final    MODERATE WBC PRESENT,BOTH PMN AND MONONUCLEAR FEW GRAM POSITIVE COCCI IN PAIRS Performed at [facility], [address], [place], [place] [address]    Culture MODERATE STAPHYLOCOCCUS AUREUS  Final   Report Status 12/18/2021 FINAL  Final   Organism ID, Bacteria STAPHYLOCOCCUS AUREUS  Final      Susceptibility   Staphylococcus aureus - MIC*    CIPROFLOXACIN <=0.5 SENSITIVE Sensitive     ERYTHROMYCIN <=0.25 SENSITIVE Sensitive     GENTAMICIN <=0.5 SENSITIVE Sensitive     OXACILLIN <=0.25 SENSITIVE Sensitive     TETRACYCLINE <=1 SENSITIVE Sensitive     VANCOMYCIN 1 SENSITIVE Sensitive     TRIMETH/SULFA <=10 SENSITIVE Sensitive     CLINDAMYCIN <=0.25 SENSITIVE Sensitive     RIFAMPIN <=0.5 SENSITIVE Sensitive     Inducible Clindamycin NEGATIVE Sensitive     * MODERATE STAPHYLOCOCCUS AUREUS  Acid Fast Smear (AFB)     Status: None   Collection Time: 12/16/21  3:07 PM   Specimen: Bronchial Alveolar Lavage  Result Value Ref Range Status   AFB Specimen Processing Concentration  Final   Acid Fast Smear Negative  Final    Comment: (  NOTE) Performed At: [facility] [place], [id number] Rush Farmer MD [id number]    Source (AFB) BRONCHIAL ALVEOLAR LAVAGE  Final    Comment: Performed at [facility], [place] [address], [place], [place] [address]  Fungus Culture With Stain     Status: None (Preliminary result)   Collection Time: 12/16/21  3:07 PM   Specimen: Bronchial Alveolar Lavage  Result Value Ref Range Status   Fungus Stain Final report  Final    Comment: (NOTE) Performed At: [facility] [place], [place] [id number] Rush Farmer MD [id number]    Fungus (Mycology) Culture PENDING  Incomplete   Fungal Source BRONCHIAL ALVEOLAR LAVAGE  Final    Comment: Performed at [facility], [place] [address], [place], [place] [address]  Fungus Culture Result     Status: None   Collection Time: 12/16/21  3:07 PM  Result Value Ref Range Status   Result 1 Comment  Final    Comment: (NOTE) KOH/Calcofluor preparation:  no  fungus observed. Performed At: [facility] [place], [place] [id number] Rush Farmer MD [id number]   Culture, Respiratory w Gram Stain     Status: None   Collection Time: 12/18/21  8:50 PM   Specimen: Tracheal Aspirate; Respiratory  Result Value Ref Range Status   Specimen Description TRACHEAL ASPIRATE  Final   Special Requests NONE  Final   Gram Stain   Final    FEW WBC PRESENT, PREDOMINANTLY MONONUCLEAR FEW GRAM POSITIVE COCCI RARE YEAST    Culture   Final    FEW Consistent with normal respiratory flora. No Pseudomonas species isolated Performed at [place] [address], [place], [place] [address]    Report Status 12/20/2021 FINAL  Final  Acid Fast Smear (AFB)     Status: Abnormal   Collection Time: 12/18/21  8:50 PM   Specimen: Tracheal Aspirate  Result Value Ref Range Status   AFB Specimen Processing Concentration  Final   Acid Fast Smear Positive (A)  Final    Comment: (NOTE) 4+, more than 9 acid-fast bacilli per field at 1000X magnification, direct smear Called/faxed to Erin P. at 1657 01.14.23 AMD Performed At: [facility] [place], [place] [id number] Rush Farmer MD [id number]    Source (AFB) TRACHEAL ASPIRATE  Final    Comment: Performed at [facility], [place] [address] [place], [place] [address]  Acid Fast Culture with reflexed sensitivities     Status: Abnormal   Collection Time: 12/18/21  8:50 PM   Specimen: Tracheal Aspirate  Result Value Ref Range Status   Acid Fast Culture Positive (A)  Final    Comment: (NOTE) Acid-fast bacilli have been detected in culture at 1 week; see AFB Organism ID by DNA probe Performed At: [facility] [address] [place], [place] [id number] Rush Farmer MD [id number]    Source of Sample TRACHEAL ASPIRATE  Final    Comment: Performed at [facility], [place] [address], [place], [place] [address]  AFB Organism ID By DNA Probe      Status: Abnormal   Collection Time: 12/18/21  8:50 PM  Result Value Ref Range Status   M tuberculosis complex Negative  Final   M avium complex Positive (A)  Final   M kansasii Not Indicated  Final   M gordonae Not Indicated  Final   Other: NAIDN  Final    Comment: No additional  identification testing is necessary.   Susceptibility Testing See reflex.  Final    Comment: (NOTE) Performed At: [facility] [place], [place] [id number] Rush Farmer MD [id number]     Lab Basic Metabolic Panel: Recent Labs  Lab 12/19/21 0410 12/20/21 0414 12/20/21 2139 12/21/21 0311 12/21/21 0418 12/21/21 1426 12/21/21 2115 12/22/21 0319 12/22/21 1036 12/22/21 1146 12/24/21 0817  NA 135 136   < > 137   < > 134* 135 135 136 135 141  K 3.4* 3.7   < > 5.2*   < > 4.3 4.7 4.7 4.3 4.0 5.7*  CL 100 97*  --  97*  --  90*  --  92*  --   --  97*  CO2 23 24  --  28  --  29  --  32  --   --  27  GLUCOSE 171* 131*  --  121*  --  283*  --  198*  --   --  182*  BUN 48* 67*  --  89*  --  98*  --  102*  --   --  119*  CREATININE 2.13* 2.31*  --  2.47*  --  2.40*  --  2.22*  --   --  2.21*  CALCIUM 7.7* 7.9*  --  7.6*  --  7.0*  --  6.8*  --   --  7.5*  MG 2.2 2.3  --  2.2  --   --   --   --   --   --  2.1  PHOS  --   --   --   --   --   --   --   --   --   --  4.5   < > = values in this interval not displayed.   Liver Function Tests: No results for input(s): AST, ALT, ALKPHOS, BILITOT, PROT, ALBUMIN in the last 168 hours. No results for input(s): LIPASE, AMYLASE in the last 168 hours. No results for input(s): AMMONIA in the last 168 hours. CBC: Recent Labs  Lab 12/20/21 0414 12/20/21 1430 12/21/21 0311 12/21/21 0418 12/21/21 1426 12/21/21 2115 12/22/21 0319 12/22/21 1036 12/22/21 1146 12/24/21 0516  WBC 14.1*  --  11.2*  --  12.5*  --  15.5*  --   --  19.8*  NEUTROABS  --   --   --   --  9.9*  --   --   --   --   --   HGB 6.7*   < > 7.5*   < > 7.6* 9.2* 7.7* 9.5*  9.2* 8.3*  HCT 20.8*   < > 23.9*   < > 24.7* 27.0* 24.9* 28.0* 27.0* 26.1*  MCV 99.0  --  94.1  --  93.9  --  94.0  --   --  96.7  PLT 149*  --  145*  --  160  --  190  --   --  237   < > = values in this interval not displayed.   Cardiac Enzymes: No results for input(s): CKTOTAL, CKMB, CKMBINDEX, TROPONINI in the last 168 hours. Sepsis Labs: Recent Labs  Lab 12/21/21 0311 12/21/21 1426 12/22/21 0319 12-24-21 0516  WBC 11.2* 12.5* 15.5* 19.8*    Procedures/Operations  Mechanical ventilation   Keatyn Luck 12/25/2021, 12:58 PM

## 2022-01-07 NOTE — Progress Notes (Signed)
ANTICOAGULATION CONSULT NOTE Pharmacy Consult for Heparin Indication: atrial fibrillation  No Known Allergies  Patient Measurements: Height: 5\' 3"  (160 cm) Weight: 55.1 kg (121 lb 7.6 oz) IBW/kg (Calculated) : 52.4 Heparin Dosing Weight: 47.7 kg  Vital Signs: Temp: 98.2 F (36.8 C) (01/17 1118) Temp Source: Oral (01/17 1118) BP: 120/70 (01/17 1122) Pulse Rate: 62 (01/17 1017)  Labs: Recent Labs    12/21/21 1426 12/21/21 2115 12/22/21 0319 12/22/21 1036 12/22/21 1146 12/22/21 1200 30-Dec-2021 0516 30-Dec-2021 0817  HGB 7.6*   < > 7.7* 9.5* 9.2*  --  8.3*  --   HCT 24.7*   < > 24.9* 28.0* 27.0*  --  26.1*  --   PLT 160  --  190  --   --   --  237  --   HEPARINUNFRC  --   --  0.75*  --   --  0.63 0.37  --   CREATININE 2.40*  --  2.22*  --   --   --   --  2.21*   < > = values in this interval not displayed.     Estimated Creatinine Clearance: 19.6 mL/min (A) (by C-G formula based on SCr of 2.21 mg/dL (H)).   Assessment: 71 y.o. female on heparin for a fib.   Heparin level within goal range this AM.  CBC fairly stable, no overt bleeding or complications noted.  Goal of Therapy:  Heparin level 0.3-0.7 units/ml Monitor platelets by anticoagulation protocol: Yes   Plan:  Continue IV heparin at 1400 units/hr Daily heparin level and CBC  [place] Crane, Vena Austria, BCPS, [facility] Clinical Pharmacist  12-30-2021 11:29 AM   [facility] pharmacy phone numbers are listed on [email or web address]

## 2022-01-07 NOTE — Progress Notes (Signed)
Pt has been transitioned to [facility] after Dr. Lynetta Mare spoke w/legal guardian Gildardo Pounds)

## 2022-01-07 NOTE — Procedures (Signed)
Central Venous Catheter Insertion Procedure Note  Mary Avila  [id number]  1951/05/24  Date:12/17/2021  Time:10:59 AM   Provider Performing:Pete E Tanja Port   Procedure: Insertion of Non-tunneled Central Venous [phone]) with US guidance ([id number])   Indication(s) Difficult access  Consent Risks of the procedure as well as the alternatives and risks of each were explained to the patient and/or caregiver.  Consent for the procedure was obtained and is signed in the bedside chart  Anesthesia Topical only with 1% lidocaine   Timeout Verified patient identification, verified procedure, site/side was marked, verified correct patient position, special equipment/implants available, medications/allergies/relevant history reviewed, required imaging and test results available.  Sterile Technique Maximal sterile technique including full sterile barrier drape, hand hygiene, sterile gown, sterile gloves, mask, hair covering, sterile ultrasound probe cover (if used).  Procedure Description Area of catheter insertion was cleaned with chlorhexidine and draped in sterile fashion.  With real-time ultrasound guidance a central venous catheter was placed into the right internal jugular vein. Nonpulsatile blood flow and easy flushing noted in all ports.  The catheter was sutured in place and sterile dressing applied.  Complications/Tolerance None; patient tolerated the procedure well. Chest X-ray is ordered to verify placement for internal jugular or subclavian cannulation.   Chest x-ray is not ordered for femoral cannulation.  EBL Minimal  Specimen(s) None  Simonne Martinet ACNP-BC [facility] Pulmonary/Critical Care Pager # [phone] OR # [phone] if no answer

## 2022-01-07 NOTE — Progress Notes (Signed)
Pt's time of death: [id number] on 2021-12-31   Auscultated heart and lung sound x1 min along w/Tanya S. RN ... No lungs or heart sounds auscultated.    No visible chest rise seen   Notified eLink  Printed asystole strip and placed in chart

## 2022-01-07 NOTE — Progress Notes (Signed)
Called [name],Mary Avila (Legal Guardian) at [phone] and LM.... needed consent to place another central line per Dr. Lynetta Mare.

## 2022-01-07 DEATH — deceased

## 2022-01-13 LAB — FUNGAL ORGANISM REFLEX

## 2022-01-13 LAB — FUNGUS CULTURE WITH STAIN

## 2022-01-13 LAB — FUNGUS CULTURE RESULT

## 2022-01-30 LAB — ACID FAST CULTURE WITH REFLEXED SENSITIVITIES (MYCOBACTERIA): Acid Fast Culture: NEGATIVE

## 2022-07-06 IMAGING — DX DG CHEST 1V PORT
1 series · 1 of 1 positions shown · non-contrast
Comparison: 12/21/2021

CLINICAL DATA: Central line placement.

EXAM:
PORTABLE CHEST 1 VIEW

[chest]
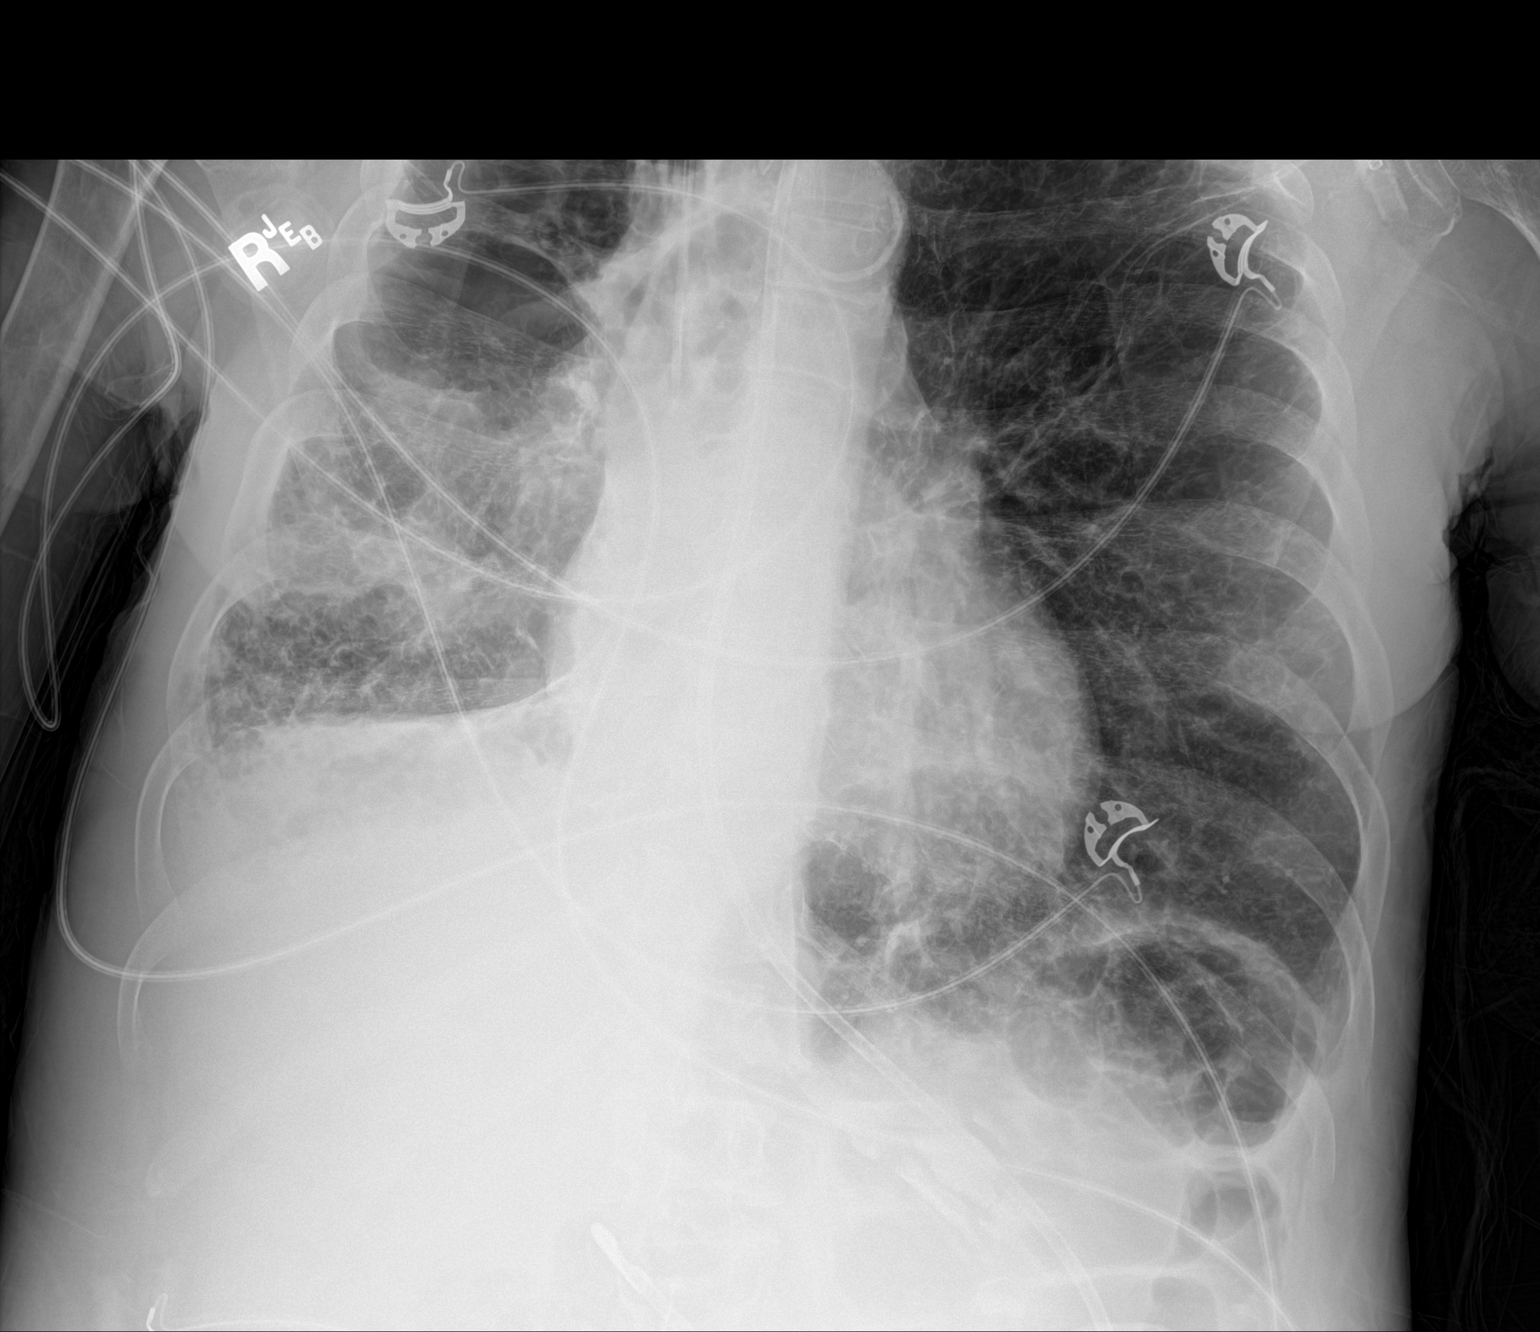

[1 of 1 positions shown; findings below may reference images not displayed]

FINDINGS: RIGHT IJ central line tip overlies the superior vena cava. LEFT IJ
central line has been withdrawn, tip overlying the LEFT lung apex.
Endotracheal tube is in place, tip approximately 5 centimeters above
the carina. Feeding tube is in place, tip in the stomach.

Heart size is normal. Lungs are hyperinflated. Coarse opacities are
identified throughout the RIGHT lung and at the LEFT lung base,
similar in appearance to prior study. No pneumothorax following line
placement.
IMPRESSION: Interval placement of RIGHT IJ central line in repositioned LEFT IJ
line. No pneumothorax.
